# Patient Record
Sex: Female | Born: 1965 | Race: White | Hispanic: No | Marital: Single | Smoking: Never smoker
Health system: Southern US, Community
[De-identification: ages and names within clinical notes are randomized; demographics above are authoritative.]

## PROBLEM LIST (undated history)

## (undated) DIAGNOSIS — R519 Headache, unspecified: Secondary | ICD-10-CM

## (undated) DIAGNOSIS — M810 Age-related osteoporosis without current pathological fracture: Secondary | ICD-10-CM

## (undated) DIAGNOSIS — Z8739 Personal history of other diseases of the musculoskeletal system and connective tissue: Secondary | ICD-10-CM

## (undated) DIAGNOSIS — I10 Essential (primary) hypertension: Secondary | ICD-10-CM

## (undated) DIAGNOSIS — R51 Headache: Secondary | ICD-10-CM

## (undated) HISTORY — PX: WISDOM TOOTH EXTRACTION: SHX21

## (undated) HISTORY — DX: Age-related osteoporosis without current pathological fracture: M81.0

## (undated) HISTORY — DX: Personal history of other diseases of the musculoskeletal system and connective tissue: Z87.39

---

## 1998-11-12 ENCOUNTER — Other Ambulatory Visit: Admission: RE | Admit: 1998-11-12 | Discharge: 1998-11-12 | Payer: Self-pay | Admitting: Obstetrics and Gynecology

## 2000-06-07 ENCOUNTER — Other Ambulatory Visit: Admission: RE | Admit: 2000-06-07 | Discharge: 2000-06-07 | Payer: Self-pay | Admitting: Obstetrics and Gynecology

## 2000-07-15 ENCOUNTER — Other Ambulatory Visit: Admission: RE | Admit: 2000-07-15 | Discharge: 2000-07-15 | Payer: Self-pay | Admitting: Obstetrics and Gynecology

## 2000-07-15 ENCOUNTER — Encounter (INDEPENDENT_AMBULATORY_CARE_PROVIDER_SITE_OTHER): Payer: Self-pay

## 2001-05-16 ENCOUNTER — Other Ambulatory Visit: Admission: RE | Admit: 2001-05-16 | Discharge: 2001-05-16 | Payer: Self-pay | Admitting: Obstetrics and Gynecology

## 2001-11-01 ENCOUNTER — Other Ambulatory Visit: Admission: RE | Admit: 2001-11-01 | Discharge: 2001-11-01 | Payer: Self-pay | Admitting: Obstetrics & Gynecology

## 2002-11-09 ENCOUNTER — Other Ambulatory Visit: Admission: RE | Admit: 2002-11-09 | Discharge: 2002-11-09 | Payer: Self-pay | Admitting: Obstetrics and Gynecology

## 2005-03-18 ENCOUNTER — Other Ambulatory Visit: Admission: RE | Admit: 2005-03-18 | Discharge: 2005-03-18 | Payer: Self-pay | Admitting: Obstetrics and Gynecology

## 2005-09-30 ENCOUNTER — Ambulatory Visit: Payer: Self-pay | Admitting: Gastroenterology

## 2005-11-02 ENCOUNTER — Ambulatory Visit: Payer: Self-pay | Admitting: Gastroenterology

## 2007-02-23 ENCOUNTER — Encounter: Admission: RE | Admit: 2007-02-23 | Discharge: 2007-02-23 | Payer: Self-pay | Admitting: Obstetrics and Gynecology

## 2009-09-17 ENCOUNTER — Emergency Department (HOSPITAL_COMMUNITY): Admission: EM | Admit: 2009-09-17 | Discharge: 2009-09-18 | Payer: Self-pay | Admitting: Emergency Medicine

## 2009-10-04 ENCOUNTER — Encounter: Admission: RE | Admit: 2009-10-04 | Discharge: 2009-10-04 | Payer: Self-pay | Admitting: Gastroenterology

## 2010-08-05 LAB — COMPREHENSIVE METABOLIC PANEL
AST: 18 U/L (ref 0–37)
Alkaline Phosphatase: 44 U/L (ref 39–117)
BUN: 13 mg/dL (ref 6–23)
Chloride: 108 mEq/L (ref 96–112)
GFR calc Af Amer: >60 mL/min (ref >60)
Glucose, Bld: 91 mg/dL (ref 70–99)
Sodium: 137 mEq/L (ref 135–145)
Total Protein: 6.6 g/dL (ref 6.0–8.3)

## 2010-08-05 LAB — CBC
HCT: 38.5 % (ref 36.0–46.0)
Hemoglobin: 12.8 g/dL (ref 12.0–15.0)
MCHC: 33.2 g/dL (ref 30.0–36.0)
MCV: 92.3 fL (ref 78.0–100.0)
RDW: 13.4 % (ref 11.5–15.5)
WBC: 7.1 10*3/uL (ref 4.0–10.5)

## 2010-08-05 LAB — DIFFERENTIAL
Basophils Relative: 0 % (ref 0–1)
Eosinophils Absolute: 0 10*3/uL (ref 0.0–0.7)
Lymphs Abs: 2.3 10*3/uL (ref 0.7–4.0)
Monocytes Absolute: 0.7 10*3/uL (ref 0.1–1.0)
Monocytes Relative: 10 % (ref 3–12)
Neutro Abs: 4 10*3/uL (ref 1.7–7.7)
Neutrophils Relative %: 57 % (ref 43–77)

## 2010-08-05 LAB — URINALYSIS, ROUTINE W REFLEX MICROSCOPIC: Specific Gravity, Urine: 1.018 (ref 1.005–1.030)

## 2010-08-05 LAB — URINE MICROSCOPIC-ADD ON

## 2010-08-05 LAB — PREGNANCY, URINE: Preg Test, Ur: NEGATIVE

## 2011-05-19 DIAGNOSIS — C4492 Squamous cell carcinoma of skin, unspecified: Secondary | ICD-10-CM

## 2011-05-19 HISTORY — DX: Squamous cell carcinoma of skin, unspecified: C44.92

## 2013-08-27 ENCOUNTER — Encounter (HOSPITAL_COMMUNITY): Payer: Self-pay | Admitting: Emergency Medicine

## 2013-08-27 ENCOUNTER — Emergency Department (HOSPITAL_COMMUNITY)
Admission: EM | Admit: 2013-08-27 | Discharge: 2013-08-28 | Disposition: A | Discharge status: Home / Self Care | Payer: BC Managed Care – PPO | Attending: Emergency Medicine | Admitting: Emergency Medicine

## 2013-08-27 DIAGNOSIS — K8020 Calculus of gallbladder without cholecystitis without obstruction: Secondary | ICD-10-CM | POA: Insufficient documentation

## 2013-08-27 DIAGNOSIS — K8050 Calculus of bile duct without cholangitis or cholecystitis without obstruction: Secondary | ICD-10-CM

## 2013-08-27 DIAGNOSIS — R112 Nausea with vomiting, unspecified: Secondary | ICD-10-CM | POA: Insufficient documentation

## 2013-08-27 DIAGNOSIS — K838 Other specified diseases of biliary tract: Secondary | ICD-10-CM | POA: Insufficient documentation

## 2013-08-27 DIAGNOSIS — R079 Chest pain, unspecified: Secondary | ICD-10-CM | POA: Insufficient documentation

## 2013-08-27 DIAGNOSIS — K828 Other specified diseases of gallbladder: Secondary | ICD-10-CM

## 2013-08-27 DIAGNOSIS — Z3202 Encounter for pregnancy test, result negative: Secondary | ICD-10-CM | POA: Insufficient documentation

## 2013-08-27 LAB — COMPREHENSIVE METABOLIC PANEL
ALT: 17 U/L (ref 0–35)
AST: 21 U/L (ref 0–37)
Albumin: 4.1 g/dL (ref 3.5–5.2)
Alkaline Phosphatase: 61 U/L (ref 39–117)
BILIRUBIN TOTAL: 0.4 mg/dL (ref 0.3–1.2)
BUN: 14 mg/dL (ref 6–23)
CO2: 22 meq/L (ref 19–32)
Calcium: 9.2 mg/dL (ref 8.4–10.5)
Chloride: 104 mEq/L (ref 96–112)
Creatinine, Ser: 0.79 mg/dL (ref 0.50–1.10)
GFR calc Af Amer: >90 mL/min (ref >90)
GFR calc non Af Amer: >90 mL/min (ref >90)
GLUCOSE: 113 mg/dL — AB (ref 70–99)
POTASSIUM: 4 meq/L (ref 3.7–5.3)
SODIUM: 138 meq/L (ref 137–147)
Total Protein: 7.6 g/dL (ref 6.0–8.3)

## 2013-08-27 LAB — CBC WITH DIFFERENTIAL/PLATELET
Basophils Absolute: 0 10*3/uL (ref 0.0–0.1)
Basophils Relative: 0 % (ref 0–1)
EOS ABS: 0 10*3/uL (ref 0.0–0.7)
EOS PCT: 0 % (ref 0–5)
HCT: 43.2 % (ref 36.0–46.0)
Hemoglobin: 14.7 g/dL (ref 12.0–15.0)
LYMPHS PCT: 6 % — AB (ref 12–46)
Lymphs Abs: 0.6 10*3/uL — ABNORMAL LOW (ref 0.7–4.0)
MCH: 30.9 pg (ref 26.0–34.0)
MCHC: 34 g/dL (ref 30.0–36.0)
MCV: 90.8 fL (ref 78.0–100.0)
MONO ABS: 0.7 10*3/uL (ref 0.1–1.0)
MONOS PCT: 6 % (ref 3–12)
NEUTROS PCT: 88 % — AB (ref 43–77)
Neutro Abs: 9 10*3/uL — ABNORMAL HIGH (ref 1.7–7.7)
PLATELETS: 225 10*3/uL (ref 150–400)
RBC: 4.76 MIL/uL (ref 3.87–5.11)
RDW: 13.2 % (ref 11.5–15.5)
WBC: 10.3 10*3/uL (ref 4.0–10.5)

## 2013-08-27 LAB — I-STAT TROPONIN, ED: TROPONIN I, POC: 0 ng/mL (ref 0.00–0.08)

## 2013-08-27 LAB — LIPASE, BLOOD: LIPASE: 44 U/L (ref 11–59)

## 2013-08-27 NOTE — ED Notes (Signed)
Pt states that at 4pm she started having midepigastric abdominal/chest pain. Took OTC meds w/ no relief. Some N/V. Denies cardiac or abdominal hx. Alert and oriented.

## 2013-08-28 ENCOUNTER — Emergency Department (HOSPITAL_COMMUNITY): Payer: BC Managed Care – PPO

## 2013-08-28 LAB — URINE MICROSCOPIC-ADD ON

## 2013-08-28 LAB — URINALYSIS, ROUTINE W REFLEX MICROSCOPIC
Bilirubin Urine: NEGATIVE
Glucose, UA: NEGATIVE mg/dL
Hgb urine dipstick: NEGATIVE
KETONES UR: 40 mg/dL — AB
NITRITE: NEGATIVE
PH: 6.5 (ref 5.0–8.0)
PROTEIN: NEGATIVE mg/dL
Specific Gravity, Urine: 1.024 (ref 1.005–1.030)
Urobilinogen, UA: 0.2 mg/dL (ref 0.0–1.0)

## 2013-08-28 LAB — PREGNANCY, URINE: PREG TEST UR: NEGATIVE

## 2013-08-28 LAB — I-STAT TROPONIN, ED: Troponin i, poc: 0 ng/mL (ref 0.00–0.08)

## 2013-08-28 MED ORDER — HYDROCODONE-ACETAMINOPHEN 5-325 MG PO TABS: 1.0000 | ORAL_TABLET | Freq: Four times a day (QID) | ORAL | Status: DC | PRN

## 2013-08-28 MED ORDER — ONDANSETRON 4 MG PO TBDP
4.0000 mg | ORAL_TABLET | Freq: Once | ORAL | Status: AC
  Administered 2013-08-28: 4 mg via ORAL
  Filled 2013-08-28: qty 1

## 2013-08-28 MED ORDER — OXYCODONE-ACETAMINOPHEN 5-325 MG PO TABS
2.0000 | ORAL_TABLET | Freq: Once | ORAL | Status: DC
  Filled 2013-08-28: qty 2

## 2013-08-28 MED ORDER — MORPHINE SULFATE 4 MG/ML IJ SOLN
4.0000 mg | Freq: Once | INTRAMUSCULAR | Status: DC
  Filled 2013-08-28: qty 1

## 2013-08-28 MED ORDER — OXYCODONE-ACETAMINOPHEN 5-325 MG PO TABS
1.0000 | ORAL_TABLET | Freq: Once | ORAL | Status: AC
  Administered 2013-08-28: 1 via ORAL

## 2013-08-28 MED ORDER — ONDANSETRON HCL 4 MG PO TABS: 4.0000 mg | ORAL_TABLET | Freq: Four times a day (QID) | ORAL | Status: DC

## 2013-08-28 MED ORDER — ONDANSETRON HCL 4 MG/2ML IJ SOLN
4.0000 mg | Freq: Once | INTRAMUSCULAR | Status: DC
  Filled 2013-08-28: qty 2

## 2013-08-28 MED ORDER — SODIUM CHLORIDE 0.9 % IV BOLUS (SEPSIS): 1000.0000 mL | Freq: Once | INTRAVENOUS | Status: DC

## 2013-08-28 NOTE — ED Provider Notes (Signed)
Medical screening examination/treatment/procedure(s) were performed by non-physician practitioner and as supervising physician I was immediately available for consultation/collaboration.   EKG Interpretation None        Hoy Morn, MD 08/28/13 [phone]

## 2013-08-28 NOTE — Discharge Instructions (Signed)
Recommend followup with general surgery. You may take Norco as prescribed for severe pain and Zofran as needed for nausea/vomiting. Return to the emergency department if symptoms worsen such as if you develop worsening abdominal pain, fever, uncontrolled vomiting, or any of the symptoms listed below.  Biliary Colic  Biliary colic is a steady or irregular pain in the upper abdomen. It is usually under the right side of the rib cage. It happens when gallstones interfere with the normal flow of bile from the gallbladder. Bile is a liquid that helps to digest fats. Bile is made in the liver and stored in the gallbladder. When you eat a meal, bile passes from the gallbladder through the cystic duct and the common bile duct into the small intestine. There, it mixes with partially digested food. If a gallstone blocks either of these ducts, the normal flow of bile is blocked. The muscle cells in the bile duct contract forcefully to try to move the stone. This causes the pain of biliary colic.  SYMPTOMS   A person with biliary colic usually complains of pain in the upper abdomen. This pain can be:  In the center of the upper abdomen just below the breastbone.  In the upper-right part of the abdomen, near the gallbladder and liver.  Spread back toward the right shoulder blade.  Nausea and vomiting.  The pain usually occurs after eating.  Biliary colic is usually triggered by the digestive system's demand for bile. The demand for bile is high after fatty meals. Symptoms can also occur when a person who has been fasting suddenly eats a very large meal. Most episodes of biliary colic pass after 1 to 5 hours. After the most intense pain passes, your abdomen may continue to ache mildly for about 24 hours. DIAGNOSIS  After you describe your symptoms, your caregiver will perform a physical exam. He or she will pay attention to the upper right portion of your belly (abdomen). This is the area of your liver and  gallbladder. An ultrasound will help your caregiver look for gallstones. Specialized scans of the gallbladder may also be done. Blood tests may be done, especially if you have fever or if your pain persists. PREVENTION  Biliary colic can be prevented by controlling the risk factors for gallstones. Some of these risk factors, such as heredity, increasing age, and pregnancy are a normal part of life. Obesity and a high-fat diet are risk factors you can change through a healthy lifestyle. Women going through menopause who take hormone replacement therapy (estrogen) are also more likely to develop biliary colic. TREATMENT   Pain medication may be prescribed.  You may be encouraged to eat a fat-free diet.  If the first episode of biliary colic is severe, or episodes of colic keep retuning, surgery to remove the gallbladder (cholecystectomy) is usually recommended. This procedure can be done through small incisions using an instrument called a laparoscope. The procedure often requires a brief stay in the hospital. Some people can leave the hospital the same day. It is the most widely used treatment in people troubled by painful gallstones. It is effective and safe, with no complications in more than 90% of cases.  If surgery cannot be done, medication that dissolves gallstones may be used. This medication is expensive and can take months or years to work. Only small stones will dissolve.  Rarely, medication to dissolve gallstones is combined with a procedure called shock-wave lithotripsy. This procedure uses carefully aimed shock waves to break up  gallstones. In many people treated with this procedure, gallstones form again within a few years. PROGNOSIS  If gallstones block your cystic duct or common bile duct, you are at risk for repeated episodes of biliary colic. There is also a 25% chance that you will develop a gallbladder infection(acute cholecystitis), or some other complication of gallstones within  10 to 20 years. If you have surgery, schedule it at a time that is convenient for you and at a time when you are not sick. HOME CARE INSTRUCTIONS   Drink plenty of clear fluids.  Avoid fatty, greasy or fried foods, or any foods that make your pain worse.  Take medications as directed. SEEK MEDICAL CARE IF:   You develop a fever over 100.5 F (38.1 C).  Your pain gets worse over time.  You develop nausea that prevents you from eating and drinking.  You develop vomiting. SEEK IMMEDIATE MEDICAL CARE IF:   You have continuous or severe belly (abdominal) pain which is not relieved with medications.  You develop nausea and vomiting which is not relieved with medications.  You have symptoms of biliary colic and you suddenly develop a fever and shaking chills. This may signal cholecystitis. Call your caregiver immediately.  You develop a yellow color to your skin or the white part of your eyes (jaundice). Document Released: 10/05/2005 Document Revised: 07/27/2011 Document Reviewed: 12/15/2007 [facility] Patient Information 2014 [place].

## 2013-08-28 NOTE — ED Provider Notes (Signed)
Patient care assumed from [facility], PA-C at shift change with U/S pending. Patient presenting for mid epigastric abdominal/chest pain with onset 4 PM. Plan discussed with [place], PA-C which includes discharge if imaging reveal no emergent findings.  4580 - Ultrasound today reveals sludge present within the gallbladder. There is no gallbladder wall thickening, pericholecystic fluid, or sonographic Murphy sign. Symptoms have improved since arrival, per patient. She has no leukocytosis today or elevated LFTs to suggest acute cholecystitis. Get an ultrasound findings, believe patient to be stable and appropriate for discharge today with referral to general surgery to discuss elective cholecystectomy should symptoms persist. Will prescribe pain and nausea medicine for symptom control as needed. Return precautions discussed and imaging results reviewed with patient. Patient agreeable to plan with no unaddressed concerns.  US Abdomen Complete  08/28/2013   CLINICAL DATA:  Abdominal pain  EXAM: ULTRASOUND ABDOMEN COMPLETE  COMPARISON:  None.  FINDINGS: Gallbladder:  There is no cholelithiasis. There is a sludge ball present within the gallbladder. There is a 5 mm non mobile echogenic focus along the gallbladder wall which may represent adherent gallbladder sludge versus a gallbladder polyp. There is no gallbladder wall thickening or pericholecystic fluid. Negative sonographic Murphy sign.  Common bile duct:  Diameter: 4 mm  Liver:  No focal lesion identified. Within normal limits in parenchymal echogenicity.  IVC:  No abnormality visualized.  Pancreas:  Visualized portion unremarkable.  Spleen:  Size and appearance within normal limits.  Right Kidney:  Length: 10.2 cm. Echogenicity within normal limits. No mass or hydronephrosis visualized.  Left Kidney:  Length: 11.1 cm. Echogenicity within normal limits. No mass or hydronephrosis visualized.  Abdominal aorta:  No aneurysm visualized.  Other findings:  None.   IMPRESSION: 1. No sonographic evidence of acute cholecystitis. 2. Gallbladder sludge with possible 5 mm gallbladder polyp.   Electronically Signed   By: Kathreen Devoid   On: 08/28/2013 03:12      Antonietta Breach, PA-C 08/28/13 9983

## 2013-08-28 NOTE — ED Provider Notes (Signed)
CSN: [id number]     Arrival date & time 08/27/13  2101 History   First MD Initiated Contact with Patient 08/28/13 0005     Chief Complaint  Patient presents with  . Abdominal Pain     (Consider location/radiation/quality/duration/timing/severity/associated sxs/prior Treatment) The history is provided by the patient. No language interpreter was used.  Kristi Rush is a 48 y/o F with no known significant past medical history presenting to the ED with epigastric/right upper quadrant/chest pain that started this afternoon at approximately 4:00 PM. Patient reports that the pain started abruptly. Reported that the pain was described as an intense aching sharp pain. Patient stated that she took an antacid with some relief - stated that the pain was bearable for approximately 45 minutes, but reported that the pain would continuously come back. Patient reported that she took an [employer] without relief. Reported that she has been feeling nauseous with one episode of emesis - NB/NB. Reported that her last BM was today, normal. Denied fever, chills, shortness of breath, difficulty breathing, dizziness, abdominal pain, weakness, syncope, chest I., difficulty passing flatulence, melena, hematochezia. No history of hypertension, diabetes, cardiac issues. PCP none   History reviewed. No pertinent past medical history. History reviewed. No pertinent past surgical history. No family history on file. History  Substance Use Topics  . Smoking status: Never Smoker   . Smokeless tobacco: Not on file  . Alcohol Use: No   OB History   Grav Para Term Preterm Abortions TAB SAB Ect Mult Living                 Review of Systems  Constitutional: Negative for fever and chills.  Respiratory: Negative for shortness of breath.   Cardiovascular: Positive for chest pain.  Gastrointestinal: Positive for nausea, vomiting and abdominal pain. Negative for diarrhea, constipation, blood in stool and anal bleeding.   Musculoskeletal: Negative for back pain and neck pain.  Neurological: Negative for weakness.  All other systems reviewed and are negative.     Allergies  Review of patient's allergies indicates no known allergies.  Home Medications   Current Outpatient Rx  Name  Route  Sig  Dispense  Refill  . aspirin-sod bicarb-citric acid (ALKA-SELTZER) 325 MG TBEF tablet   Oral   Take 325 mg by mouth every 6 (six) hours as needed (pain).         . naproxen sodium (ANAPROX) 220 MG tablet   Oral   Take 440 mg by mouth 2 (two) times daily as needed (pain).         . topiramate (TOPAMAX) 100 MG tablet   Oral   Take 100 mg by mouth at bedtime.          BP 121/72  Pulse 63  Temp(Src) 98.1 F (36.7 C) (Oral)  Resp 18  SpO2 97% Physical Exam  Nursing note and vitals reviewed. Constitutional: She is oriented to person, place, and time. She appears well-developed and well-nourished. No distress.  HENT:  Head: Normocephalic and atraumatic.  Mouth/Throat: Oropharynx is clear and moist. No oropharyngeal exudate.  Eyes: Conjunctivae and EOM are normal. Pupils are equal, round, and reactive to light. Right eye exhibits no discharge. Left eye exhibits no discharge.  Neck: Normal range of motion. Neck supple. No tracheal deviation present.  Cardiovascular: Normal rate, regular rhythm and normal heart sounds.  Exam reveals no friction rub.   No murmur heard. Pulses:      Radial pulses are 2+ on the right  side, and 2+ on the left side.       Dorsalis pedis pulses are 2+ on the right side, and 2+ on the left side.  Pulmonary/Chest: Effort normal and breath sounds normal. No respiratory distress. She has no wheezes. She has no rales. She exhibits tenderness.  Discomfort upon palpation to the chest wall - substernal region  Patient is able to speak in full sentences without difficulty  Negative stridor Negative use of accessory muscles  Abdominal: Soft. Bowel sounds are normal. There is  tenderness. There is no guarding.  Discomfort upon palpation to the RUQ, epigastric, and LUQ - most discomfort to the RUQ Positive Murphy's sign Negative McBurney's point  Musculoskeletal: Normal range of motion.  Full ROM to upper and lower extremities without difficulty noted, negative ataxia noted.  Lymphadenopathy:    She has no cervical adenopathy.  Neurological: She is alert and oriented to person, place, and time. No cranial nerve deficit. She exhibits normal muscle tone. Coordination normal.  Skin: Skin is warm and dry. No rash noted. She is not diaphoretic. No erythema.  Psychiatric: She has a normal mood and affect. Her behavior is normal. Thought content normal.    ED Course  Procedures (including critical care time)  Results for orders placed during the hospital encounter of 08/27/13  CBC WITH DIFFERENTIAL      Result Value Ref Range   WBC 10.3  4.0 - 10.5 K/uL   RBC 4.76  3.87 - 5.11 MIL/uL   Hemoglobin 14.7  12.0 - 15.0 g/dL   HCT 43.2  36.0 - 46.0 %   MCV 90.8  78.0 - 100.0 fL   MCH 30.9  26.0 - 34.0 pg   MCHC 34.0  30.0 - 36.0 g/dL   RDW 13.2  11.5 - 15.5 %   Platelets 225  150 - 400 K/uL   Neutrophils Relative % 88 (*) 43 - 77 %   Neutro Abs 9.0 (*) 1.7 - 7.7 K/uL   Lymphocytes Relative 6 (*) 12 - 46 %   Lymphs Abs 0.6 (*) 0.7 - 4.0 K/uL   Monocytes Relative 6  3 - 12 %   Monocytes Absolute 0.7  0.1 - 1.0 K/uL   Eosinophils Relative 0  0 - 5 %   Eosinophils Absolute 0.0  0.0 - 0.7 K/uL   Basophils Relative 0  0 - 1 %   Basophils Absolute 0.0  0.0 - 0.1 K/uL  COMPREHENSIVE METABOLIC PANEL      Result Value Ref Range   Sodium 138  137 - 147 mEq/L   Potassium 4.0  3.7 - 5.3 mEq/L   Chloride 104  96 - 112 mEq/L   CO2 22  19 - 32 mEq/L   Glucose, Bld 113 (*) 70 - 99 mg/dL   BUN 14  6 - 23 mg/dL   Creatinine, Ser 0.79  0.50 - 1.10 mg/dL   Calcium 9.2  8.4 - 10.5 mg/dL   Total Protein 7.6  6.0 - 8.3 g/dL   Albumin 4.1  3.5 - 5.2 g/dL   AST 21  0 - 37 U/L    ALT 17  0 - 35 U/L   Alkaline Phosphatase 61  39 - 117 U/L   Total Bilirubin 0.4  0.3 - 1.2 mg/dL   GFR calc non Af Amer >90  >90 mL/min   GFR calc Af Amer >90  >90 mL/min  LIPASE, BLOOD      Result Value Ref Range   Lipase  44  11 - 59 U/L  URINALYSIS, ROUTINE W REFLEX MICROSCOPIC      Result Value Ref Range   Color, Urine YELLOW  YELLOW   APPearance CLOUDY (*) CLEAR   Specific Gravity, Urine 1.024  1.005 - 1.030   pH 6.5  5.0 - 8.0   Glucose, UA NEGATIVE  NEGATIVE mg/dL   Hgb urine dipstick NEGATIVE  NEGATIVE   Bilirubin Urine NEGATIVE  NEGATIVE   Ketones, ur 40 (*) NEGATIVE mg/dL   Protein, ur NEGATIVE  NEGATIVE mg/dL   Urobilinogen, UA 0.2  0.0 - 1.0 mg/dL   Nitrite NEGATIVE  NEGATIVE   Leukocytes, UA TRACE (*) NEGATIVE  URINE MICROSCOPIC-ADD ON      Result Value Ref Range   Squamous Epithelial / LPF MANY (*) RARE   WBC, UA 3-6  <3 WBC/hpf   RBC / HPF 0-2  <3 RBC/hpf   Bacteria, UA MANY (*) RARE  I-STAT TROPOININ, ED      Result Value Ref Range   Troponin i, poc 0.00  0.00 - 0.08 ng/mL   Comment 3             Labs Review Labs Reviewed  CBC WITH DIFFERENTIAL - Abnormal; Notable for the following:    Neutrophils Relative % 88 (*)    Neutro Abs 9.0 (*)    Lymphocytes Relative 6 (*)    Lymphs Abs 0.6 (*)    All other components within normal limits  COMPREHENSIVE METABOLIC PANEL - Abnormal; Notable for the following:    Glucose, Bld 113 (*)    All other components within normal limits  URINALYSIS, ROUTINE W REFLEX MICROSCOPIC - Abnormal; Notable for the following:    APPearance CLOUDY (*)    Ketones, ur 40 (*)    Leukocytes, UA TRACE (*)    All other components within normal limits  URINE MICROSCOPIC-ADD ON - Abnormal; Notable for the following:    Squamous Epithelial / LPF MANY (*)    Bacteria, UA MANY (*)    All other components within normal limits  LIPASE, BLOOD  I-STAT TROPOININ, ED  I-STAT TROPOININ, ED   Imaging Review Dg Chest 2 View  08/28/2013    CLINICAL DATA:  Upper abdominal pain  EXAM: CHEST  2 VIEW  COMPARISON:  CT ABD/PELVIS W CM dated 10/04/2009  FINDINGS: The heart size and mediastinal contours are within normal limits. Both lungs are clear. The visualized skeletal structures are unremarkable.  IMPRESSION: No active cardiopulmonary disease.   Electronically Signed   By: Kathreen Devoid   On: 08/28/2013 00:50     EKG Interpretation None      Date: 08/28/2013  Rate: 70  Rhythm: normal sinus rhythm  QRS Axis: normal  Intervals: normal  ST/T Wave abnormalities: normal  Conduction Disutrbances:none  Narrative Interpretation:   Old EKG Reviewed: none available EKG analyzed and reviewed by this provider and attending physician   MDM   Final diagnoses:  None   Medications  morphine 4 MG/ML injection 4 mg (not administered)  ondansetron (ZOFRAN) injection 4 mg (not administered)  sodium chloride 0.9 % bolus 1,000 mL (not administered)   Filed Vitals:   08/27/13 2125 08/28/13 0149  BP: 122/82 121/72  Pulse: 82 63  Temp: 98.1 F (36.7 C)   TempSrc: Oral   Resp:  18  SpO2: 98% 97%    Patient presenting to the ED with chest discomfort localized to the epigastric region and RUQ with discomfort in the substernal region that  started yesterday afternoon at approximately 4:00PM. Patient reported that the pain is a dull aching sensation. Reported that she used antacids with minimal relief, reported alka seltzer did not help. Reported nausea with one episode of emesis.  Alert and oriented. GCS 15. Heart rate and rhythm normal. Lungs clear to auscultation to upper and lower lobes bilaterally. Radial and DP pulses 2+ bilaterally. Negative signs of respiratory distress. Patient stable to speak in full sentences without difficulty. Negative stridor or use of accessory muscles. Bowel sounds normoactive in all 4 quadrants with discomfort upon palpation to the epigastric, left upper quadrant and right upper quadrant regions-most discomfort  upon right upper quadrant. Positive Murphy's sign. Negative McBurney's point. Negative peritoneal signs. EKG noted normal sinus rhythm with a heart rate of 70 bpm - negative ischemic findings noted. First troponin negative elevation. CBC noted negative elevated white blood cell count-elevated neutrophils at 9.0. CMP negative findings-kidneys and liver function well, negative elevated alkaline phosphatase. Negative elevated lipase. Urinalysis negative hemoglobin identified-negative hematuria. Positive trace of leukocytes with many bacteria with white blood cell count of 3-6. Chest x-ray negative for acute cardiac pulmonary disease. Suspicion to be possible gallbladder issue. Patient given IV fluids, pain medications and time medications. Ultrasound of the abdomen has been ordered results pending. At this time patient does not appear septic-patient stable, afebrile. Discussed case with Antonietta Breach, PA-C. Transfer of care to [facility], PA-C at change in shift.   Jamse Mead, PA-C 08/28/13 1136

## 2013-08-29 LAB — URINE CULTURE
Colony Count: 9000
Special Requests: NORMAL

## 2013-09-03 NOTE — ED Provider Notes (Signed)
Medical screening examination/treatment/procedure(s) were conducted as a shared visit with non-physician practitioner(s) and myself.  I personally evaluated the patient during the encounter.   EKG Interpretation   Date/Time:  Sunday August 27 2013 21:36:32 EDT Ventricular Rate:  70 PR Interval:  168 QRS Duration: 89 QT Interval:  407 QTC Calculation: 439 R Axis:   88 Text Interpretation:  Sinus rhythm Consider right atrial enlargement  Baseline wander in lead(s) I ED PHYSICIAN INTERPRETATION AVAILABLE IN [place]  [facility] Confirmed by TEST, Record ([id number]) on 08/29/2013 7:52:18 AM      Overall well appearing. Improvement in symptoms. Biliary colic. No signs to suggest cholecystitis. General surgery follow up  Hoy Morn, MD 09/03/13 [phone]

## 2013-09-11 ENCOUNTER — Encounter (INDEPENDENT_AMBULATORY_CARE_PROVIDER_SITE_OTHER): Payer: Self-pay | Admitting: Surgery

## 2013-09-11 ENCOUNTER — Ambulatory Visit (INDEPENDENT_AMBULATORY_CARE_PROVIDER_SITE_OTHER): Payer: BC Managed Care – PPO | Admitting: Surgery

## 2013-09-11 VITALS — BP 124/74 | HR 68 | Temp 97.5°F | Ht 62.0 in | Wt 170.6 lb

## 2013-09-11 DIAGNOSIS — E669 Obesity, unspecified: Secondary | ICD-10-CM | POA: Insufficient documentation

## 2013-09-11 DIAGNOSIS — K8010 Calculus of gallbladder with chronic cholecystitis without obstruction: Secondary | ICD-10-CM

## 2013-09-11 DIAGNOSIS — K824 Cholesterolosis of gallbladder: Secondary | ICD-10-CM

## 2013-09-11 DIAGNOSIS — K5909 Other constipation: Secondary | ICD-10-CM

## 2013-09-11 DIAGNOSIS — K5900 Constipation, unspecified: Secondary | ICD-10-CM

## 2013-09-11 NOTE — Patient Instructions (Addendum)
Please consider the recommendations that we have given you today:  Consider laparoscopic removal of her gallbladder for symptomatic sludge and gallbladder polyp.  Consider more aggressive fiber bowel regimen to help with yourr chronic constipation.  See the Handout(s) we have given you.  Please call our office at [phone] if you wish to schedule surgery or if you have further questions / concerns.   Cholecystitis Cholecystitis is an inflammation of your gallbladder. It is usually caused by a buildup of gallstones or sludge (cholelithiasis) in your gallbladder. The gallbladder stores a fluid that helps digest fats (bile). Cholecystitis is serious and needs treatment right away.  CAUSES   Gallstones. Gallstones can block the tube that leads to your gallbladder, causing bile to build up. As bile builds up, the gallbladder becomes inflamed.  Bile duct problems, such as blockage from scarring or kinking.  Tumors. Tumors can stop bile from leaving your gallbladder correctly, causing bile to build up. As bile builds up, the gallbladder becomes inflamed. SYMPTOMS   Nausea.  Vomiting.  Abdominal pain, especially in the upper right area of your abdomen.  Abdominal tenderness or bloating.  Sweating.  Chills.  Fever.  Yellowing of the skin and the whites of the eyes (jaundice). DIAGNOSIS  Your caregiver may order blood tests to look for infection or gallbladder problems. Your caregiver may also order imaging tests, such as an ultrasound or computed tomography (CT) scan. Further tests may include a hepatobiliary iminodiacetic acid (HIDA) scan. This scan allows your caregiver to see your bile move from the liver to the gallbladder and to the small intestine. TREATMENT  A hospital stay is usually necessary to lessen the inflammation of your gallbladder. You may be required to not eat or drink (fast) for a certain amount of time. You may be given medicine to treat pain or an antibiotic  medicine to treat an infection. Surgery may be needed to remove your gallbladder (cholecystectomy) once the inflammation has gone down. Surgery may be needed right away if you develop complications such as death of gallbladder tissue (gangrene) or a tear (perforation) of the gallbladder.  [place] care will depend on your treatment. In general:  If you were given antibiotics, take them as directed. Finish them even if you start to feel better.  Only take over-the-counter or prescription medicines for pain, discomfort, or fever as directed by your caregiver.  Follow a low-fat diet until you see your caregiver again.  Keep all follow-up visits as directed by your caregiver. SEEK IMMEDIATE MEDICAL CARE IF:   Your pain is increasing and not controlled by medicines.  Your pain moves to another part of your abdomen or to your back.  You have a fever.  You have nausea and vomiting. MAKE SURE YOU:  Understand these instructions.  Will watch your condition.  Will get help right away if you are not doing well or get worse. Document Released: 05/04/2005 Document Revised: 07/27/2011 Document Reviewed: 03/20/2011 [facility] Patient Information 2014 [place], [place].  GETTING TO GOOD BOWEL HEALTH. Irregular bowel habits such as constipation and diarrhea can lead to many problems over time.  Having one soft bowel movement a day is the most important way to prevent further problems.  The anorectal canal is designed to handle stretching and feces to safely manage our ability to get rid of solid waste (feces, poop, stool) out of our body.  BUT, hard constipated stools can act like ripping concrete bricks and diarrhea can be a burning  fire to this very sensitive area of our body, causing inflamed hemorrhoids, anal fissures, increasing risk is perirectal abscesses, abdominal pain/bloating, an making irritable bowel worse.     The goal: ONE SOFT BOWEL MOVEMENT A DAY!  To have soft,  regular bowel movements:    Drink at least 8 tall glasses of water a day.     Take plenty of fiber.  Fiber is the undigested part of plant food that passes into the colon, acting s "natures broom" to encourage bowel motility and movement.  Fiber can absorb and hold large amounts of water. This results in a larger, bulkier stool, which is soft and easier to pass. Work gradually over several weeks up to 6 servings a day of fiber (25g a day even more if needed) in the form of: o Vegetables -- Root (potatoes, carrots, turnips), leafy green (lettuce, salad greens, celery, spinach), or cooked high residue (cabbage, broccoli, etc) o Fruit -- Fresh (unpeeled skin & pulp), Dried (prunes, apricots, cherries, etc ),  or stewed ( applesauce)  o Whole grain breads, pasta, etc (whole wheat)  o Bran cereals    Bulking Agents -- This type of water-retaining fiber generally is easily obtained each day by one of the following:  o Psyllium bran -- The psyllium plant is remarkable because its ground seeds can retain so much water. This product is available as Metamucil, Konsyl, Effersyllium, Per Diem Fiber, or the less expensive generic preparation in drug and health food stores. Although labeled a laxative, it really is not a laxative.  o Methylcellulose -- This is another fiber derived from wood which also retains water. It is available as Citrucel. o Polyethylene Glycol - and "artificial" fiber commonly called Miralax or Glycolax.  It is helpful for people with gassy or bloated feelings with regular fiber o Flax Seed - a less gassy fiber than psyllium   No reading or other relaxing activity while on the toilet. If bowel movements take longer than 5 minutes, you are too constipated   AVOID CONSTIPATION.  High fiber and water intake usually takes care of this.  Sometimes a laxative is needed to stimulate more frequent bowel movements, but    Laxatives are not a good long-term solution as it can wear the colon  out. o Osmotics (Milk of Magnesia, Fleets phosphosoda, Magnesium citrate, MiraLax, GoLytely) are safer than  o Stimulants (Senokot, Castor Oil, Dulcolax, Ex Lax)    o Do not take laxatives for more than 7days in a row.    IF SEVERELY CONSTIPATED, try a Bowel Retraining Program: o Do not use laxatives.  o Eat a diet high in roughage, such as bran cereals and leafy vegetables.  o Drink six (6) ounces of prune or apricot juice each morning.  o Eat two (2) large servings of stewed fruit each day.  o Take one (1) heaping tablespoon of a psyllium-based bulking agent twice a day. Use sugar-free sweetener when possible to avoid excessive calories.  o Eat a normal breakfast.  o Set aside 15 minutes after breakfast to sit on the toilet, but do not strain to have a bowel movement.  o If you do not have a bowel movement by the third day, use an enema and repeat the above steps.    Controlling diarrhea o Switch to liquids and simpler foods for a few days to avoid stressing your intestines further. o Avoid dairy products (especially milk & ice cream) for a short time.  The intestines often can  lose the ability to digest lactose when stressed. o Avoid foods that cause gassiness or bloating.  Typical foods include beans and other legumes, cabbage, broccoli, and dairy foods.  Every person has some sensitivity to other foods, so listen to our body and avoid those foods that trigger problems for you. o Adding fiber (Citrucel, Metamucil, psyllium, Miralax) gradually can help thicken stools by absorbing excess fluid and retrain the intestines to act more normally.  Slowly increase the dose over a few weeks.  Too much fiber too soon can backfire and cause cramping & bloating. o Probiotics (such as active yogurt, Align, etc) may help repopulate the intestines and colon with normal bacteria and calm down a sensitive digestive tract.  Most studies show it to be of mild help, though, and such products can be  costly. o Medicines:   Bismuth subsalicylate (ex. Kayopectate, Pepto Bismol) every 30 minutes for up to 6 doses can help control diarrhea.  Avoid if pregnant.   Loperamide (Immodium) can slow down diarrhea.  Start with two tablets (4mg  total) first and then try one tablet every 6 hours.  Avoid if you are having fevers or severe pain.  If you are not better or start feeling worse, stop all medicines and call your doctor for advice o Call your doctor if you are getting worse or not better.  Sometimes further testing (cultures, endoscopy, X-ray studies, bloodwork, etc) may be needed to help diagnose and treat the cause of the diarrhea.  LAPAROSCOPIC SURGERY: POST OP INSTRUCTIONS  1. DIET: Follow a light bland diet the first 24 hours after arrival home, such as soup, liquids, crackers, etc.  Be sure to include lots of fluids daily.  Avoid fast food or heavy meals as your are more likely to get nauseated.  Eat a low fat the next few days after surgery.   2. Take your usually prescribed home medications unless otherwise directed. 3. PAIN CONTROL: a. Pain is best controlled by a usual combination of three different methods TOGETHER: i. Ice/Heat ii. Over the counter pain medication iii. Prescription pain medication b. Most patients will experience some swelling and bruising around the incisions.  Ice packs or heating pads (30-60 minutes up to 6 times a day) will help. Use ice for the first few days to help decrease swelling and bruising, then switch to heat to help relax tight/sore spots and speed recovery.  Some people prefer to use ice alone, heat alone, alternating between ice & heat.  Experiment to what works for you.  Swelling and bruising can take several weeks to resolve.   c. It is helpful to take an over-the-counter pain medication regularly for the first few weeks.  Choose one of the following that works best for you: i. Naproxen (Aleve, etc)  Two 220mg  tabs twice a day ii. Ibuprofen (Advil, etc)  Three 200mg  tabs four times a day (every meal & bedtime) iii. Acetaminophen (Tylenol, etc) 500-650mg  four times a day (every meal & bedtime) d. A  prescription for pain medication (such as oxycodone, hydrocodone, etc) should be given to you upon discharge.  Take your pain medication as prescribed.  i. If you are having problems/concerns with the prescription medicine (does not control pain, nausea, vomiting, rash, itching, etc), please call us [phone] to see if we need to switch you to a different pain medicine that will work better for you and/or control your side effect better. ii. If you need a refill on your pain medication, please contact  your pharmacy.  They will contact our office to request authorization. Prescriptions will not be filled after 5 pm or on week-ends. 4. Avoid getting constipated.  Between the surgery and the pain medications, it is common to experience some constipation.  Increasing fluid intake and taking a fiber supplement (such as Metamucil, Citrucel, FiberCon, MiraLax, etc) 1-2 times a day regularly will usually help prevent this problem from occurring.  A mild laxative (prune juice, Milk of Magnesia, MiraLax, etc) should be taken according to package directions if there are no bowel movements after 48 hours.   5. Watch out for diarrhea.  If you have many loose bowel movements, simplify your diet to bland foods & liquids for a few days.  Stop any stool softeners and decrease your fiber supplement.  Switching to mild anti-diarrheal medications (Kayopectate, Pepto Bismol) can help.  If this worsens or does not improve, please call us. 6. Wash / shower every day.  You may shower over the dressings as they are waterproof.  Continue to shower over incision(s) after the dressing is off. 7. Remove your waterproof bandages 5 days after surgery.  You may leave the incision open to air.  You may replace a dressing/Band-Aid to cover the incision for comfort if you wish.  8. ACTIVITIES  as tolerated:   a. You may resume regular (light) daily activities beginning the next day-such as daily self-care, walking, climbing stairs-gradually increasing activities as tolerated.  If you can walk 30 minutes without difficulty, it is safe to try more intense activity such as jogging, treadmill, bicycling, low-impact aerobics, swimming, etc. b. Save the most intensive and strenuous activity for last such as sit-ups, heavy lifting, contact sports, etc  Refrain from any heavy lifting or straining until you are off narcotics for pain control.   c. DO NOT PUSH THROUGH PAIN.  Let pain be your guide: If it hurts to do something, don't do it.  Pain is your body warning you to avoid that activity for another week until the pain goes down. d. You may drive when you are no longer taking prescription pain medication, you can comfortably wear a seatbelt, and you can safely maneuver your car and apply brakes. e. Dennis Bast may have sexual intercourse when it is comfortable.  9. FOLLOW UP in our office a. Please call [facility] at [phone] to set up an appointment to see your surgeon in the office for a follow-up appointment approximately 2-3 weeks after your surgery. b. Make sure that you call for this appointment the day you arrive home to insure a convenient appointment time. 10. IF YOU HAVE DISABILITY OR FAMILY LEAVE FORMS, BRING THEM TO THE OFFICE FOR PROCESSING.  DO NOT GIVE THEM TO YOUR DOCTOR.   WHEN TO CALL us [phone]: 1. Poor pain control 2. Reactions / problems with new medications (rash/itching, nausea, etc)  3. Fever over 101.5 F (38.5 C) 4. Inability to urinate 5. Nausea and/or vomiting 6. Worsening swelling or bruising 7. Continued bleeding from incision. 8. Increased pain, redness, or drainage from the incision   The clinic staff is available to answer your questions during regular business hours (8:30am-5pm).  Please don't hesitate to call and ask to speak to one of our nurses for  clinical concerns.   If you have a medical emergency, go to the nearest emergency room or call 911.  A surgeon from [facility] is always on call at the [facility], [place]  [address] [place], [place], [place]  [address] ? MAIN: [phone] ? TOLL FREE: [phone] ?  FAX [phone] [email or web address]  Constipation, Adult Constipation is when a person has fewer than 3 bowel movements a week; has difficulty having a bowel movement; or has stools that are dry, hard, or larger than normal. As people grow older, constipation is more common. If you try to fix constipation with medicines that make you have a bowel movement (laxatives), the problem may get worse. Long-term laxative use may cause the muscles of the colon to become weak. A low-fiber diet, not taking in enough fluids, and taking certain medicines may make constipation worse. CAUSES   Certain medicines, such as antidepressants, pain medicine, iron supplements, antacids, and water pills.   Certain diseases, such as diabetes, irritable bowel syndrome (IBS), thyroid disease, or depression.   Not drinking enough water.   Not eating enough fiber-rich foods.   Stress or travel.  Lack of physical activity or exercise.  Not going to the restroom when there is the urge to have a bowel movement.  Ignoring the urge to have a bowel movement.  Using laxatives too much. SYMPTOMS   Having fewer than 3 bowel movements a week.   Straining to have a bowel movement.   Having hard, dry, or larger than normal stools.   Feeling full or bloated.   Pain in the lower abdomen.  Not feeling relief after having a bowel movement. DIAGNOSIS  Your caregiver will take a medical history and perform a physical exam. Further testing may be done for severe constipation. Some tests may include:   A barium enema X-ray to examine your rectum, colon, and sometimes, your small  intestine.  A sigmoidoscopy to examine your lower colon.  A colonoscopy to examine your entire colon. TREATMENT  Treatment will depend on the severity of your constipation and what is causing it. Some dietary treatments include drinking more fluids and eating more fiber-rich foods. Lifestyle treatments may include regular exercise. If these diet and lifestyle recommendations do not help, your caregiver may recommend taking over-the-counter laxative medicines to help you have bowel movements. Prescription medicines may be prescribed if over-the-counter medicines do not work.  HOME CARE INSTRUCTIONS   Increase dietary fiber in your diet, such as fruits, vegetables, whole grains, and beans. Limit high-fat and processed sugars in your diet, such as [place] fries, hamburgers, cookies, candies, and soda.   A fiber supplement may be added to your diet if you cannot get enough fiber from foods.   Drink enough fluids to keep your urine clear or pale yellow.   Exercise regularly or as directed by your caregiver.   Go to the restroom when you have the urge to go. Do not hold it.  Only take medicines as directed by your caregiver. Do not take other medicines for constipation without talking to your caregiver first. [place] IF:   You have bright red blood in your stool.   Your constipation lasts for more than 4 days or gets worse.   You have abdominal or rectal pain.   You have thin, pencil-like stools.  You have unexplained weight loss. MAKE SURE YOU:   Understand these instructions.  Will watch your condition.  Will get help right away if you are not doing well or get worse. Document Released: 01/31/2004 Document Revised: 07/27/2011 Document Reviewed: 02/13/2013 [facility] Patient Information 2014 [place], [place].

## 2013-09-11 NOTE — Progress Notes (Signed)
Subjective:     Patient ID: Kristi Rush, female   DOB: [birth year], 48 y.o.   MRN: [id number]  HPI  Note: This dictation was prepared with Dragon/digital dictation along with [facility] technology. Any transcriptional errors that result from this process are unintentional.       ETTIE KRONTZ  1965/11/09 [id number]  Patient Care Team: No Pcp Per Patient as PCP - General (General Practice)  This patient is a 48 y.o.female who presents today for surgical evaluation at the request of Dr. Jola Schmidt, [facility] ED.   Reason for visit: Biliary colic with sludge/polyp  Pleasant obese female struggled with epigatric/RUQ 10/10 sharp pain.  NO help with walking/sips.  Recurring crampy pains.  Worsened - nausea/vomiting.  They have had a similar episode in 2011 according to ER visits.  CT scan that was negative.  Perhaps he gastroenterology.  Struggles with chronic constipation.  [place] nephew gastroenterologist in town.  Colonoscopy.  They recommended bowel regimen.  She said that has not helped.  Working with a nutritionist now.  She walks born miles a day.  She is to have a bowel movement once a week.  She now has one twice a week   No history of hepatitis or pancreatitis.  No personal nor family history of GI/colon cancer, inflammatory bowel disease, irritable bowel syndrome, allergy such as Celiac Sprue, dietary/dairy problems, colitis, ulcers nor gastritis.  No recent sick contacts/gastroenteritis.  No travel outside the country.  No changes in diet.  No dysphagia to solids or liquids.  No significant heartburn or reflux.  No hematochezia, hematemesis, coffee ground emesis.  No evidence of prior gastric/peptic ulceration.  She drinks alcohol maybe a few times a month.  No binge drinking.   Patient Active Problem List   Diagnosis Date Noted  . Polyp of gallbladder 09/11/2013  . Chronic cholecystitis with sludge/calculus 09/11/2013  . Obesity (BMI 30-39.9) 09/11/2013  . Constipation, chronic 09/11/2013     History reviewed. No pertinent past medical history.  History reviewed. No pertinent past surgical history.  History   Social History  . Marital Status: Married    Spouse Name: N/A    Number of Children: N/A  . Years of Education: N/A   Occupational History  . Not on file.   Social History Main Topics  . Smoking status: Never Smoker   . Smokeless tobacco: Not on file  . Alcohol Use: No  . Drug Use: No  . Sexual Activity: Not on file   Other Topics Concern  . Not on file   Social History Narrative  . No narrative on file    History reviewed. No pertinent family history.  Current Outpatient Prescriptions  Medication Sig Dispense Refill  . naproxen sodium (ANAPROX) 220 MG tablet Take 440 mg by mouth 2 (two) times daily as needed (pain).      . topiramate (TOPAMAX) 100 MG tablet Take 100 mg by mouth at bedtime.       No current facility-administered medications for this visit.     No Known Allergies  BP 124/74  Pulse 68  Temp(Src) 97.5 F (36.4 C)  Ht 5\' 2"  (1.575 m)  Wt 170 lb 9.6 oz (77.384 kg)  BMI 31.20 kg/m2  Dg Chest 2 View  08/28/2013   CLINICAL DATA:  Upper abdominal pain  EXAM: CHEST  2 VIEW  COMPARISON:  CT ABD/PELVIS W CM dated 10/04/2009  FINDINGS: The heart size and mediastinal contours are within normal limits. Both lungs are clear.  The visualized skeletal structures are unremarkable.  IMPRESSION: No active cardiopulmonary disease.   Electronically Signed   By: Kathreen Devoid   On: 08/28/2013 00:50   US Abdomen Complete  08/28/2013   CLINICAL DATA:  Abdominal pain  EXAM: ULTRASOUND ABDOMEN COMPLETE  COMPARISON:  None.  FINDINGS: Gallbladder:  There is no cholelithiasis. There is a sludge ball present within the gallbladder. There is a 5 mm non mobile echogenic focus along the gallbladder wall which may represent adherent gallbladder sludge versus a gallbladder polyp. There is no gallbladder wall thickening or pericholecystic fluid. Negative  sonographic Murphy sign.  Common bile duct:  Diameter: 4 mm  Liver:  No focal lesion identified. Within normal limits in parenchymal echogenicity.  IVC:  No abnormality visualized.  Pancreas:  Visualized portion unremarkable.  Spleen:  Size and appearance within normal limits.  Right Kidney:  Length: 10.2 cm. Echogenicity within normal limits. No mass or hydronephrosis visualized.  Left Kidney:  Length: 11.1 cm. Echogenicity within normal limits. No mass or hydronephrosis visualized.  Abdominal aorta:  No aneurysm visualized.  Other findings:  None.  IMPRESSION: 1. No sonographic evidence of acute cholecystitis. 2. Gallbladder sludge with possible 5 mm gallbladder polyp.   Electronically Signed   By: Kathreen Devoid   On: 08/28/2013 03:12     Review of Systems  Constitutional: Negative for fever, chills, diaphoresis, appetite change and fatigue.  HENT: Negative for ear discharge, ear pain, sore throat and trouble swallowing.   Eyes: Negative for photophobia, discharge and visual disturbance.  Respiratory: Negative for cough, choking, chest tightness and shortness of breath.   Cardiovascular: Negative for chest pain and palpitations.  Gastrointestinal: Positive for nausea, vomiting, abdominal pain and constipation. Negative for diarrhea, anal bleeding and rectal pain.  Endocrine: Negative for cold intolerance and heat intolerance.  Genitourinary: Negative for dysuria, frequency and difficulty urinating.  Musculoskeletal: Negative for gait problem, myalgias and neck pain.  Skin: Negative for color change, pallor and rash.  Allergic/Immunologic: Negative for environmental allergies, food allergies and immunocompromised state.  Neurological: Negative for dizziness, speech difficulty, weakness and numbness.  Hematological: Negative for adenopathy.  Psychiatric/Behavioral: Negative for confusion and agitation. The patient is not nervous/anxious.        Objective:   Physical Exam  Constitutional: She  is oriented to person, place, and time. She appears well-developed and well-nourished. No distress.  HENT:  Head: Normocephalic.  Mouth/Throat: Oropharynx is clear and moist. No oropharyngeal exudate.  Eyes: Conjunctivae and EOM are normal. Pupils are equal, round, and reactive to light. No scleral icterus.  Neck: Normal range of motion. Neck supple. No tracheal deviation present.  Cardiovascular: Normal rate, regular rhythm and intact distal pulses.   Pulmonary/Chest: Effort normal and breath sounds normal. No stridor. No respiratory distress. She exhibits no tenderness.  Abdominal: Soft. She exhibits no distension and no mass. There is no tenderness. Hernia confirmed negative in the right inguinal area and confirmed negative in the left inguinal area.  Genitourinary: No vaginal discharge found.  Musculoskeletal: Normal range of motion. She exhibits no tenderness.       Right elbow: She exhibits normal range of motion.       Left elbow: She exhibits normal range of motion.       Right wrist: She exhibits normal range of motion.       Left wrist: She exhibits normal range of motion.       Right hand: Normal strength noted.  Left hand: Normal strength noted.  Lymphadenopathy:       Head (right side): No posterior auricular adenopathy present.       Head (left side): No posterior auricular adenopathy present.    She has no cervical adenopathy.    She has no axillary adenopathy.       Right: No inguinal adenopathy present.       Left: No inguinal adenopathy present.  Neurological: She is alert and oriented to person, place, and time. No cranial nerve deficit. She exhibits normal muscle tone. Coordination normal.  Skin: Skin is warm and dry. No rash noted. She is not diaphoretic. No erythema.  Psychiatric: Her speech is normal and behavior is normal. Judgment and thought content normal. Cognition and memory are normal.  Giggly/smiling at 1st       Assessment:     Postparandial  nausea, vomiting, & right upper quadrant pain concerning for chronic cholecystitis with gallbladder sludge and polyp     Plan:     I think she would benefit from cholecystectomy since she has had another attack.  She elected to wait until after the school year in early June.  Single site lap chole OK>  I cautioned her that if she has another attack that is severe that she may require more urgent intervention.  However, is reasonable see if we can get away with waiting:  The anatomy & physiology of hepatobiliary & pancreatic function was discussed.  The pathophysiology of gallbladder dysfunction was discussed.  Natural history risks without surgery was discussed.   I feel the risks of no intervention will lead to serious problems that outweigh the operative risks; therefore, I recommended cholecystectomy to remove the pathology.  I explained laparoscopic techniques with possible need for an open approach.  Probable cholangiogram to evaluate the bilary tract was explained as well.    Risks such as bleeding, infection, abscess, leak, injury to other organs, need for further treatment, heart attack, death, and other risks were discussed.  I noted a good likelihood this will help address the problem.  Possibility that this will not correct all abdominal symptoms was explained.  Goals of post-operative recovery were discussed as well.  We will work to minimize complications.  An educational handout further explaining the pathology and treatment options was given as well.  Questions were answered.  The patient expresses understanding & wishes to proceed with surgery.  Consider restarting some type of bladder fiber regimen for constipation.  Suspect MiraLAX Will work since she has some gassiness with Metamucil.  Probably needs 3-4 doses a day.  Consider gastroenterology reevaluation is still struggling. 1) Avoid extremes of bowel movements (no bad constipation/diarrhea) 2) Miralax 17gm mixed in 8oz. water or  juice-daily. Start BID & then increase PRN.  3) Gas-x,Phazyme, etc. as needed for gas & bloating.  4) Soft,bland diet. No spicy,greasy,fried foods.  5) Prilosec OTC as needed  6) May hold gluten/wheat products from diet to see if symptoms improve.  7)  May try probiotics (Align, Activa, etc) to help calm the bowels down 7) If symptoms become worse call back immediately.

## 2013-09-20 ENCOUNTER — Telehealth (INDEPENDENT_AMBULATORY_CARE_PROVIDER_SITE_OTHER): Payer: Self-pay | Admitting: Surgery

## 2013-09-20 NOTE — Telephone Encounter (Signed)
Talked to patient about surgery gave dates patient will call back to schedule with payment.

## 2014-01-08 ENCOUNTER — Other Ambulatory Visit: Payer: Self-pay | Admitting: Obstetrics and Gynecology

## 2014-01-08 DIAGNOSIS — R928 Other abnormal and inconclusive findings on diagnostic imaging of breast: Secondary | ICD-10-CM

## 2014-01-17 ENCOUNTER — Ambulatory Visit
Admission: RE | Admit: 2014-01-17 | Discharge: 2014-01-17 | Disposition: A | Discharge status: Home / Self Care | Payer: BC Managed Care – PPO | Source: Ambulatory Visit | Attending: Obstetrics and Gynecology | Admitting: Obstetrics and Gynecology

## 2014-01-17 ENCOUNTER — Encounter (INDEPENDENT_AMBULATORY_CARE_PROVIDER_SITE_OTHER): Payer: Self-pay

## 2014-01-17 ENCOUNTER — Other Ambulatory Visit: Payer: Self-pay | Admitting: Obstetrics and Gynecology

## 2014-01-17 DIAGNOSIS — R928 Other abnormal and inconclusive findings on diagnostic imaging of breast: Secondary | ICD-10-CM

## 2014-02-02 ENCOUNTER — Ambulatory Visit
Admission: RE | Admit: 2014-02-02 | Discharge: 2014-02-02 | Disposition: A | Discharge status: Home / Self Care | Payer: BC Managed Care – PPO | Source: Ambulatory Visit | Attending: Obstetrics and Gynecology | Admitting: Obstetrics and Gynecology

## 2014-02-02 DIAGNOSIS — R928 Other abnormal and inconclusive findings on diagnostic imaging of breast: Secondary | ICD-10-CM

## 2014-09-03 ENCOUNTER — Other Ambulatory Visit (HOSPITAL_COMMUNITY): Payer: Self-pay | Admitting: Nurse Practitioner

## 2014-09-03 ENCOUNTER — Ambulatory Visit (HOSPITAL_COMMUNITY)
Admission: RE | Admit: 2014-09-03 | Discharge: 2014-09-03 | Disposition: A | Discharge status: Home / Self Care | Payer: BC Managed Care – PPO | Source: Ambulatory Visit | Attending: Surgery | Admitting: Surgery

## 2014-09-03 DIAGNOSIS — R609 Edema, unspecified: Secondary | ICD-10-CM | POA: Diagnosis not present

## 2015-02-07 ENCOUNTER — Other Ambulatory Visit: Payer: Self-pay | Admitting: Obstetrics and Gynecology

## 2015-02-08 LAB — CYTOLOGY - PAP

## 2015-03-06 ENCOUNTER — Other Ambulatory Visit: Payer: Self-pay | Admitting: Obstetrics and Gynecology

## 2015-03-06 DIAGNOSIS — Z809 Family history of malignant neoplasm, unspecified: Secondary | ICD-10-CM

## 2015-03-27 ENCOUNTER — Inpatient Hospital Stay: Admission: RE | Admit: 2015-03-27 | Payer: BC Managed Care – PPO | Source: Ambulatory Visit

## 2016-02-12 LAB — HM PAP SMEAR: HM Pap smear: NOT DETECTED

## 2016-02-12 LAB — HM MAMMOGRAPHY

## 2017-03-08 ENCOUNTER — Encounter: Payer: Self-pay | Admitting: Physician Assistant

## 2017-03-08 ENCOUNTER — Ambulatory Visit (INDEPENDENT_AMBULATORY_CARE_PROVIDER_SITE_OTHER): Payer: BC Managed Care – PPO | Admitting: Physician Assistant

## 2017-03-08 VITALS — BP 140/90 | HR 73 | Temp 98.0°F | Resp 18 | Ht 63.5 in | Wt 189.8 lb

## 2017-03-08 DIAGNOSIS — Z1212 Encounter for screening for malignant neoplasm of rectum: Secondary | ICD-10-CM

## 2017-03-08 DIAGNOSIS — I10 Essential (primary) hypertension: Secondary | ICD-10-CM

## 2017-03-08 DIAGNOSIS — Z1211 Encounter for screening for malignant neoplasm of colon: Secondary | ICD-10-CM

## 2017-03-08 MED ORDER — LOSARTAN POTASSIUM 50 MG PO TABS: 50.0000 mg | ORAL_TABLET | Freq: Every day | ORAL | 0 refills | Status: DC

## 2017-03-08 NOTE — Progress Notes (Signed)
Patient ID: Kristi Rush MRN: [id number], DOB: 1966/03/04, 51 y.o. Date of Encounter: @DATE @  Chief Complaint:  Chief Complaint  Patient presents with  . New Patient (Initial Visit)    HPI: 51 y.o. year old female  presents as a New Patient to Establish Care.  She states that she had been seeing Dr. Florina Rush until her office closed.  Then had established with an Kristi Rush but wasn't pleased and is just taken her time to finally get established here.  Reports that she has no known medical problems that have required any chronic meds.  Reports that the only medicine that she has prescribed is from orthopedics-- naproxen.  Says that she was running and fell on a cable in the ground.  Ultimately, found out she had several fractures in her foot but by the time the fractures were found they had already been healing. Had to wear a shoe. Had decreased activity during that time and has had significant weight gain. Also wearing that shoe/boot caused an imbalance and caused problems on the other side of her body.  Says that that injury was exactly one year ago -- was October 2017-- says all that's been going on for a year. Says that she sees a Dr. Telford Rush at [facility].  Reports that she does see her gynecologist annually. Sees Dr. Radene Rush. States that she has mammogram performed there in his office.  Reports that he checks her labs including thyroid and cholesterol etc.  She reports that she had migraines when she was in high school and college but then they subsided.  Reports that recently she has had some headaches but they have felt different than her past migraines.  Says that since noticing these headaches, she has been checking her blood pressure at home recently and has been getting elevated readings over the past 4-5 weeks. Getting systolic readings in the 474Q ---the highest has been 156. Diastolic readings are in the 80s, 90s and the highest has been 106.  No  other specific concerns to address today.  No other known medical history.  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   History reviewed. No pertinent past medical history.   Home Meds: Outpatient Medications Prior to Visit  Medication Sig Dispense Refill  . naproxen sodium (ANAPROX) 220 MG tablet Take 440 mg by mouth 2 (two) times daily as needed (pain).    . topiramate (TOPAMAX) 100 MG tablet Take 100 mg by mouth at bedtime.     No facility-administered medications prior to visit.     Allergies: No Known Allergies  Social History   Social History  . Marital status: Married    Spouse name: N/A  . Number of children: N/A  . Years of education: N/A   Occupational History  . Teacher [employer]    4th grade   Social History Main Topics  . Smoking status: Never Smoker  . Smokeless tobacco: Never Used  . Alcohol use No  . Drug use: No  . Sexual activity: Not on file   Other Topics Concern  . Not on file   Social History Narrative  . No narrative on file    Family History  Problem Relation Age of Onset  . Breast cancer Mother 76  . Diabetes Father      Review of Systems:  See HPI for pertinent ROS. All other ROS negative.    Physical Exam: Blood pressure 140/90, pulse 73, temperature 98 F (  36.7 C), temperature source Oral, resp. rate 18, height 5' 3.5" (1.613 m), weight 86.1 kg (189 lb 12.8 oz), SpO2 99 %., Body mass index is 33.09 kg/m. General: WF.  Appears in no acute distress. Neck: Supple. No thyromegaly. No lymphadenopathy. Lungs: Clear bilaterally to auscultation without wheezes, rales, or rhonchi. Breathing is unlabored. Heart: RRR with S1 S2. No murmurs, rubs, or gallops. Abdomen: Soft, non-tender, non-distended with normoactive bowel sounds. No hepatomegaly. No rebound/guarding. No obvious abdominal masses. Musculoskeletal:  Strength and tone normal for age. Extremities/Skin: Warm and dry.  No LE edema.  Neuro:  Alert and oriented X 3. Moves all extremities spontaneously. Gait is normal. CNII-XII grossly in tact. Psych:  Responds to questions appropriately with a normal affect.     ASSESSMENT AND PLAN:  51 y.o. year old female with  1. Essential hypertension Check baseline lab today. She will start losartan 51 mg daily. She will return for follow-up visit in 2 weeks to recheck BP and BMET on medication. - BASIC METABOLIC PANEL WITH GFR - losartan (COZAAR) 50 MG tablet; Take 1 tablet (50 mg total) by mouth daily.  Dispense: 30 tablet; Refill: 0  Today also discussed preventative care. She sees GYN Dr. Radene Rush annually. He does screening lab work. He does mammogram there in his office. Today I had patient sign a release form to get copy of mammogram. Discussed colonoscopy for colorectal cancer screening. She is agreeable to for me to refer her to GI for this. She states that she had a tetanus vaccine about 5 or 6 years ago. She does not want to get the flu vaccine. Aware of risk versus benefit but defers.  2. Screening for colorectal cancer - Ambulatory referral to Gastroenterology   Signed, Kristi Rush, [place], [facility] 03/08/2017 4:00 PM

## 2017-03-09 LAB — BASIC METABOLIC PANEL WITH GFR
BUN: 16 mg/dL (ref 7–25)
CO2: 27 mmol/L (ref 20–32)
Calcium: 9.5 mg/dL (ref 8.6–10.4)
Chloride: 106 mmol/L (ref 98–110)
Creat: 0.88 mg/dL (ref 0.50–1.05)
GFR, Est African American: 88 mL/min/{1.73_m2} (ref >=60)
GFR, Est Non African American: 76 mL/min/{1.73_m2} (ref >=60)
GLUCOSE: 90 mg/dL (ref 65–99)
POTASSIUM: 4.4 mmol/L (ref 3.5–5.3)
SODIUM: 142 mmol/L (ref 135–146)

## 2017-03-10 ENCOUNTER — Encounter (INDEPENDENT_AMBULATORY_CARE_PROVIDER_SITE_OTHER): Payer: Self-pay | Admitting: *Deleted

## 2017-03-22 ENCOUNTER — Ambulatory Visit: Payer: BC Managed Care – PPO | Admitting: Physician Assistant

## 2017-03-22 ENCOUNTER — Encounter: Payer: Self-pay | Admitting: Physician Assistant

## 2017-03-22 VITALS — BP 130/86 | HR 89 | Temp 97.8°F | Resp 14 | Ht 64.0 in | Wt 189.2 lb

## 2017-03-22 DIAGNOSIS — I10 Essential (primary) hypertension: Secondary | ICD-10-CM

## 2017-03-22 MED ORDER — LOSARTAN POTASSIUM 50 MG PO TABS: 50.0000 mg | ORAL_TABLET | Freq: Every day | ORAL | 1 refills | Status: DC

## 2017-03-22 NOTE — Progress Notes (Addendum)
Patient ID: Kristi Rush MRN: [id number], DOB: [birth year], 51 y.o. Date of Encounter: @DATE @  Chief Complaint:  Chief Complaint  Patient presents with  . 2 week follow up    HPI: 51 y.o. year old female     03/08/2017: presents as a New Patient to [facility].  She states that she had been seeing Dr. Florina Ou until her office closed.  Then had established with an Engineer, structural but wasn't pleased and is just taken her time to finally get established here.  Reports that she has no known medical problems that have required any chronic meds.  Reports that the only medicine that she has prescribed is from orthopedics-- naproxen.  Says that she was running and fell on a cable in the ground.  Ultimately, found out she had several fractures in her foot but by the time the fractures were found they had already been healing. Had to wear a shoe. Had decreased activity during that time and has had significant weight gain. Also wearing that shoe/boot caused an imbalance and caused problems on the other side of her body.  Says that that injury was exactly one year ago -- was October 2017-- says all that's been going on for a year. Says that she sees a Dr. Telford Nab at [facility].  Reports that she does see her gynecologist annually. Sees Dr. Radene Knee. States that she has mammogram performed there in his office.  Reports that he checks her labs including thyroid and cholesterol etc.  She reports that she had migraines when she was in high school and college but then they subsided.  Reports that recently she has had some headaches but they have felt different than her past migraines.  Says that since noticing these headaches, she has been checking her blood pressure at home recently and has been getting elevated readings over the past 4-5 weeks. Getting systolic readings in the 595G ---the highest has been 156. Diastolic readings are in the 80s, 90s and the highest has been  106.  No other specific concerns to address today.  No other known medical history.  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  A/P AT THAT OV: 1. Essential hypertension Check baseline lab today. She will start losartan 50 mg daily. She will return for follow-up visit in 2 weeks to recheck BP and BMET on medication. - BASIC METABOLIC PANEL WITH GFR - losartan (COZAAR) 50 MG tablet; Take 1 tablet (50 mg total) by mouth daily.  Dispense: 30 tablet; Refill: 0  Today also discussed preventative care. She sees GYN Dr. Radene Knee annually. He does screening lab work. He does mammogram there in his office. Today I had patient sign a release form to get copy of mammogram. Discussed colonoscopy for colorectal cancer screening. She is agreeable to for me to refer her to GI for this. She states that she had a tetanus vaccine about 5 or 6 years ago. She does not want to get the flu vaccine. Aware of risk versus benefit but defers.  2. Screening for colorectal cancer - Ambulatory referral to Gastroenterology    03/22/2017: She reports that she has been taking the losartan 50 mg daily.  This is causing no adverse effects.  Says that she has been taking her blood pressure at home and states that the readings do fluctuate.  States that 2 readings have been a little bit high but the rest of the readings have been good.  Se has no other  concerns to address today.     History reviewed. No pertinent past medical history.   Home Meds: Outpatient Medications Prior to Visit  Medication Sig Dispense Refill  . naproxen sodium (ANAPROX) 220 MG tablet Take 440 mg by mouth 2 (two) times daily as needed (pain).    Marland Kitchen losartan (COZAAR) 50 MG tablet Take 1 tablet (50 mg total) by mouth daily. 30 tablet 0   No facility-administered medications prior to visit.     Allergies: No Known Allergies  Social History   Socioeconomic History  . Marital status: Married    Spouse name: Not on  file  . Number of children: Not on file  . Years of education: Not on file  . Highest education level: Not on file  Social Needs  . Financial resource strain: Not on file  . Food insecurity - worry: Not on file  . Food insecurity - inability: Not on file  . Transportation needs - medical: Not on file  . Transportation needs - non-medical: Not on file  Occupational History  . 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 Smoking status: Never Smoker  . Smokeless tobacco: Never Used  Substance and Sexual Activity  . Alcohol use: No  . Drug use: No  . Sexual activity: Not on file  Other Topics Concern  . Not on file  Social History Narrative  . Not on file    Family History  Problem Relation Age of Onset  . Breast cancer Mother 38  . Diabetes Father      Review of Systems:  See HPI for pertinent ROS. All other ROS negative.    Physical Exam: Blood pressure 130/86, pulse 89, temperature 97.8 F (36.6 C), temperature source Oral, resp. rate 14, height 5\' 4"  (1.626 m), weight 85.8 kg (189 lb 3.2 oz), SpO2 98 %., Body mass index is 32.48 kg/m. General: WNWD WF.  Appears in no acute distress. Neck: Supple. No thyromegaly. No lymphadenopathy. Lungs: Clear bilaterally to auscultation without wheezes, rales, or rhonchi. Breathing is unlabored. Heart: RRR with S1 S2. No murmurs, rubs, or gallops. Musculoskeletal:  Strength and tone normal for age. Extremities/Skin: Warm and dry.  No LE edema.  Neuro: Alert and oriented X 3. Moves all extremities spontaneously. Gait is normal. CNII-XII grossly in tact. Psych:  Responds to questions appropriately with a normal affect.     ASSESSMENT AND PLAN:  51 y.o. year old female with   Essential hypertension Check lab today to assess lab on medication.. Will continue losartan 50 mg daily. We will continue to monitor blood pressure and document it the readings.  Consistently getting  systolic greater than 440 or diastolic greater than 90 then follow-up.  Otherwise if blood pressure remains controlled can wait 6 months for follow-up visit. - BASIC METABOLIC PANEL WITH GFR - losartan (COZAAR) 50 MG tablet; Take 1 tablet (50 mg total) by mouth daily.  Dispense: 90 tablet; Refill: 1  Preventive Care 03/08/2017: Today also discussed preventative care. She sees GYN Dr. Radene Knee annually. He does screening lab work. He does mammogram there in his office. Today I had patient sign a release form to get copy of mammogram. Discussed colonoscopy for colorectal cancer screening. She is agreeable to for me to refer her to GI for this. She states that she had a tetanus vaccine about 5 or 6 years ago. She does not want to get the flu vaccine. Aware of  risk versus benefit but defers.  Screening for colorectal cancer 03/08/2017: I placed order for referral to GI - Ambulatory referral to Gastroenterology 03/22/2017: She states that she has received notice from GI and will follow up with scheduling colonoscopy.   Addendum Added 03/29/2017:  Received records from her GYN Dr. Radene Knee. Pap smear 02/12/2016--- showed ASCUS.  HPV was negative. Pap smear 02/07/2015 was negative.  Cytology was negative for intraepithelial lesions or malignancy.  Mammogram 02/20/2016 showed no evidence of malignancy. Negative.    Routine follow-up visit 6 months.  Follow-up sooner if needed.  [address] [place], [place], [facility] 03/22/2017 4:38 PM

## 2017-03-23 LAB — BASIC METABOLIC PANEL WITH GFR
BUN: 16 mg/dL (ref 7–25)
CALCIUM: 9.4 mg/dL (ref 8.6–10.4)
CHLORIDE: 103 mmol/L (ref 98–110)
CO2: 31 mmol/L (ref 20–32)
Creat: 0.87 mg/dL (ref 0.50–1.05)
GFR, EST AFRICAN AMERICAN: 89 mL/min/{1.73_m2} (ref >=60)
GFR, Est Non African American: 77 mL/min/{1.73_m2} (ref >=60)
Glucose, Bld: 97 mg/dL (ref 65–99)
Potassium: 4.4 mmol/L (ref 3.5–5.3)
Sodium: 139 mmol/L (ref 135–146)

## 2017-04-13 ENCOUNTER — Other Ambulatory Visit: Payer: Self-pay | Admitting: Obstetrics and Gynecology

## 2017-04-13 DIAGNOSIS — Z803 Family history of malignant neoplasm of breast: Secondary | ICD-10-CM

## 2017-05-04 ENCOUNTER — Ambulatory Visit
Admission: RE | Admit: 2017-05-04 | Discharge: 2017-05-04 | Disposition: A | Discharge status: Home / Self Care | Payer: BC Managed Care – PPO | Source: Ambulatory Visit | Attending: Obstetrics and Gynecology | Admitting: Obstetrics and Gynecology

## 2017-05-04 DIAGNOSIS — Z803 Family history of malignant neoplasm of breast: Secondary | ICD-10-CM

## 2017-05-04 MED ORDER — GADOBENATE DIMEGLUMINE 529 MG/ML IV SOLN
17.0000 mL | Freq: Once | INTRAVENOUS | Status: AC | PRN
  Administered 2017-05-04: 17 mL via INTRAVENOUS

## 2017-05-21 ENCOUNTER — Other Ambulatory Visit (INDEPENDENT_AMBULATORY_CARE_PROVIDER_SITE_OTHER): Payer: Self-pay | Admitting: *Deleted

## 2017-05-21 DIAGNOSIS — Z800 Family history of malignant neoplasm of digestive organs: Secondary | ICD-10-CM | POA: Insufficient documentation

## 2017-05-21 DIAGNOSIS — Z1211 Encounter for screening for malignant neoplasm of colon: Secondary | ICD-10-CM

## 2017-05-27 ENCOUNTER — Ambulatory Visit: Payer: BC Managed Care – PPO | Admitting: Physician Assistant

## 2017-05-27 ENCOUNTER — Other Ambulatory Visit: Payer: Self-pay

## 2017-05-27 ENCOUNTER — Encounter: Payer: Self-pay | Admitting: Physician Assistant

## 2017-05-27 VITALS — BP 144/98 | HR 69 | Temp 97.8°F | Resp 18 | Ht 64.0 in | Wt 189.0 lb

## 2017-05-27 DIAGNOSIS — N951 Menopausal and female climacteric states: Secondary | ICD-10-CM | POA: Diagnosis not present

## 2017-05-27 DIAGNOSIS — I10 Essential (primary) hypertension: Secondary | ICD-10-CM | POA: Diagnosis not present

## 2017-05-27 DIAGNOSIS — G43909 Migraine, unspecified, not intractable, without status migrainosus: Secondary | ICD-10-CM | POA: Insufficient documentation

## 2017-05-27 DIAGNOSIS — G43009 Migraine without aura, not intractable, without status migrainosus: Secondary | ICD-10-CM | POA: Diagnosis not present

## 2017-05-27 MED ORDER — TOPIRAMATE 25 MG PO TABS: ORAL_TABLET | ORAL | 0 refills | Status: DC

## 2017-05-27 MED ORDER — SUMATRIPTAN SUCCINATE 100 MG PO TABS: 100.0000 mg | ORAL_TABLET | Freq: Once | ORAL | 2 refills | Status: DC | PRN

## 2017-05-27 MED ORDER — NEBIVOLOL HCL 5 MG PO TABS: 5.0000 mg | ORAL_TABLET | Freq: Every day | ORAL | 2 refills | Status: DC

## 2017-05-28 NOTE — Progress Notes (Signed)
Patient ID: ADAEZE BETTER MRN: [id number], DOB: 1965-06-21, 52 y.o. Date of Encounter: @DATE @  Chief Complaint:  Chief Complaint  Patient presents with  . blood pressure recheck  . left leg knot  . puffiness around neck area    HPI: 52 y.o. year old female     03/08/2017: presents as a New Patient to [place].  She states that she had been seeing Dr. Florina Ou until her office closed.  Then had established with an Engineer, structural but wasn't pleased and is just taken her time to finally get established here.  Reports that she has no known medical problems that have required any chronic meds.  Reports that the only medicine that she has prescribed is from orthopedics-- naproxen.  Says that she was running and fell on a cable in the ground.  Ultimately, found out she had several fractures in her foot but by the time the fractures were found they had already been healing. Had to wear a shoe. Had decreased activity during that time and has had significant weight gain. Also wearing that shoe/boot caused an imbalance and caused problems on the other side of her body.  Says that that injury was exactly one year ago -- was October 2017-- says all that's been going on for a year. Says that she sees a Dr. Telford Nab at [facility].  Reports that she does see her gynecologist annually. Sees Dr. Radene Knee. States that she has mammogram performed there in his office.  Reports that he checks her labs including thyroid and cholesterol etc.  She reports that she had migraines when she was in high school and college but then they subsided.  Reports that recently she has had some headaches but they have felt different than her past migraines.  Says that since noticing these headaches, she has been checking her blood pressure at home recently and has been getting elevated readings over the past 4-5 weeks. Getting systolic readings in the 240X ---the highest has been 156. Diastolic  readings are in the 80s, 90s and the highest has been 106.  No other specific concerns to address today.  No other known medical history.  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  A/P AT THAT OV: 1. Essential hypertension Check baseline lab today. She will start losartan 50 mg daily. She will return for follow-up visit in 2 weeks to recheck BP and BMET on medication. - BASIC METABOLIC PANEL WITH GFR - losartan (COZAAR) 50 MG tablet; Take 1 tablet (50 mg total) by mouth daily.  Dispense: 30 tablet; Refill: 0  Today also discussed preventative care. She sees GYN Dr. Radene Knee annually. He does screening lab work. He does mammogram there in his office. Today I had patient sign a release form to get copy of mammogram. Discussed colonoscopy for colorectal cancer screening. She is agreeable to for me to refer her to GI for this. She states that she had a tetanus vaccine about 5 or 6 years ago. She does not want to get the flu vaccine. Aware of risk versus benefit but defers.  2. Screening for colorectal cancer - Ambulatory referral to Gastroenterology    03/22/2017: She reports that she has been taking the losartan 50 mg daily.  This is causing no adverse effects.  Says that she has been taking her blood pressure at home and states that the readings do fluctuate.  States that 2 readings have been a little bit high but the rest of  the readings have been good.  Se has no other concerns to address today. AT THAT OV:  Started Losartan 50mg  QD   05/27/2017: Since LOV, Losartan was recalled.  However, she says she had stopped the medication prior to the recall. Says she 'felt bad", felt tired when taking it.  Says she has been feeling drained, tired, moody.  Has been having some headaches---had thought headache may be related to BP being high---but really has not seen change in headaches when on BP med (BP lower) and when off BP med (BP higher) Says she had migraines in  high school then for period of time they resolved. They returned in her 52s. "Took Topamax forever". Had no adverse effects with that -- just eventually got off med b/c was having no headaches. Also had imitrex in the past.  Discussed that these symptoms may be related to perimenopause.  She reports that at recent Gyn visit they did labs and told her labs c/w perimenopause. (She says she had Gyn procedure years ago and has had no bleeding since) Has checked BP some and getting some high readings since off losartan.     History reviewed. No pertinent past medical history.   Home Meds: Outpatient Medications Prior to Visit  Medication Sig Dispense Refill  . naproxen sodium (ANAPROX) 220 MG tablet Take 440 mg by mouth 2 (two) times daily as needed (pain).    Marland Kitchen losartan (COZAAR) 50 MG tablet Take 1 tablet (50 mg total) daily by mouth. (Patient not taking: Reported on 05/27/2017) 90 tablet 1   No facility-administered medications prior to visit.     Allergies: No Known Allergies  Social History   Socioeconomic History  . Marital status: Married    Spouse name: Not on file  . Number of children: Not on file  . Years of education: Not on file  . Highest education level: Not on file  Social Needs  . Financial resource strain: Not on file  . Food insecurity - worry: Not on file  . Food insecurity - inability: Not on file  . Transportation needs - medical: Not on file  . Transportation needs - non-medical: Not on file  Occupational History  . 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 Smoking status: Never Smoker  . Smokeless tobacco: Never Used  Substance and Sexual Activity  . Alcohol use: No  . Drug use: No  . Sexual activity: Not on file  Other Topics Concern  . Not on file  Social History Narrative  . Not on file    Family History  Problem Relation Age of Onset  . Breast cancer Mother 6  . Diabetes Father       Review of Systems:  See HPI for pertinent ROS. All other ROS negative.    Physical Exam: Blood pressure (!) 144/98, pulse 69, temperature 97.8 F (36.6 C), temperature source Oral, resp. rate 18, height 5\' 4"  (1.626 m), weight 85.7 kg (189 lb), SpO2 99 %., Body mass index is 32.44 kg/m. General: WNWD WF.  Appears in no acute distress. Neck: Supple. No thyromegaly. No lymphadenopathy. Lungs: Clear bilaterally to auscultation without wheezes, rales, or rhonchi. Breathing is unlabored. Heart: RRR with S1 S2. No murmurs, rubs, or gallops. Musculoskeletal:  Strength and tone normal for age. Extremities/Skin: Warm and dry.  No LE edema.  Neuro: Alert and oriented X 3. Moves all extremities spontaneously. Gait is normal. CNII-XII grossly  in tact. Psych:  Responds to questions appropriately with a normal affect.     ASSESSMENT AND PLAN:  53 y.o. year old female with   1. Essential hypertension Given migraines, Bystolic should be good fit for her. Gave her 2 sample bottles. She can monitor BP. If BP is good and she is having no adverse effects, cna fil Rx for # 90 ---savings card given. - nebivolol (BYSTOLIC) 5 MG tablet; Take 1 tablet (5 mg total) by mouth daily.  Dispense: 90 tablet; Refill: 2  2. Migraine without aura and without status migrainosus, not intractable Will add back Imitrex and Topamax--to titrate dose as directed. - SUMAtriptan (IMITREX) 100 MG tablet; Take 1 tablet (100 mg total) by mouth once as needed for migraine. May repeat in 2 hours if headache persists or recurs.  Dispense: 30 tablet; Refill: 2 - topiramate (TOPAMAX) 25 MG tablet; Take one at night for 1 week then Take one twice a day for 1 week then Take one in the morning, two at night for 1 week then Take 2 twice a day  Dispense: 60 tablet; Refill: 0  3. Perimenopause We discussed that perimenopause likely contributing to her symptoms.     -----------THE FOLLOWING IS COPIED FORM OV 03/08/2017---NOT  ADDRESSED AT OV 05/27/2017:----------------------------------   Preventive Care 03/08/2017: Today also discussed preventative care. She sees GYN Dr. Radene Knee annually. He does screening lab work. He does mammogram there in his office. Today I had patient sign a release form to get copy of mammogram. Discussed colonoscopy for colorectal cancer screening. She is agreeable to for me to refer her to GI for this. She states that she had a tetanus vaccine about 5 or 6 years ago. She does not want to get the flu vaccine. Aware of risk versus benefit but defers.  Screening for colorectal cancer 03/08/2017: I placed order for referral to GI - Ambulatory referral to Gastroenterology 03/22/2017: She states that she has received notice from GI and will follow up with scheduling colonoscopy.   Addendum Added 03/29/2017:  Received records from her GYN Dr. Radene Knee. Pap smear 02/12/2016--- showed ASCUS.  HPV was negative. Pap smear 02/07/2015 was negative.  Cytology was negative for intraepithelial lesions or malignancy.  Mammogram 02/20/2016 showed no evidence of malignancy. Negative.    Routine follow-up visit 6 months.  Follow-up sooner if needed.  [address] [place], [place], [facility] 05/28/2017 3:15 PM

## 2017-06-19 ENCOUNTER — Other Ambulatory Visit: Payer: Self-pay | Admitting: Physician Assistant

## 2017-06-19 DIAGNOSIS — G43009 Migraine without aura, not intractable, without status migrainosus: Secondary | ICD-10-CM

## 2017-06-21 NOTE — Telephone Encounter (Signed)
Refill appropriate 

## 2017-07-02 ENCOUNTER — Other Ambulatory Visit: Payer: Self-pay | Admitting: Physician Assistant

## 2017-07-02 DIAGNOSIS — G43009 Migraine without aura, not intractable, without status migrainosus: Secondary | ICD-10-CM

## 2017-07-07 ENCOUNTER — Telehealth: Payer: Self-pay

## 2017-07-07 DIAGNOSIS — G43009 Migraine without aura, not intractable, without status migrainosus: Secondary | ICD-10-CM

## 2017-07-07 MED ORDER — TOPIRAMATE 25 MG PO TABS: ORAL_TABLET | ORAL | 0 refills | Status: DC

## 2017-07-07 MED ORDER — TOPIRAMATE 50 MG PO TABS: 50.0000 mg | ORAL_TABLET | Freq: Two times a day (BID) | ORAL | 5 refills | Status: DC

## 2017-07-07 NOTE — Telephone Encounter (Signed)
New rx sent to pharmacy

## 2017-07-07 NOTE — Telephone Encounter (Signed)
I reviewed her office note from 05/27/17.   At that time-- prescribed Topamax 25 mg and she was to titrate up dose.   Now is up to taking 2 of those twice daily. At this point send in Topamax 50 mg----1 p.o. twice daily #60+5 refills

## 2017-07-07 NOTE — Telephone Encounter (Signed)
Patient states she is now taking  2 tablets twice a day so the quantity needs to be 120. Will make the change

## 2017-09-14 ENCOUNTER — Telehealth (INDEPENDENT_AMBULATORY_CARE_PROVIDER_SITE_OTHER): Payer: Self-pay | Admitting: *Deleted

## 2017-09-14 ENCOUNTER — Encounter (INDEPENDENT_AMBULATORY_CARE_PROVIDER_SITE_OTHER): Payer: Self-pay | Admitting: *Deleted

## 2017-09-14 MED ORDER — PEG 3350-KCL-NA BICARB-NACL 420 G PO SOLR: 4000.0000 mL | Freq: Once | ORAL | 0 refills | Status: AC

## 2017-09-14 NOTE — Telephone Encounter (Signed)
Patient needs trilyte 

## 2017-10-04 ENCOUNTER — Telehealth (INDEPENDENT_AMBULATORY_CARE_PROVIDER_SITE_OTHER): Payer: Self-pay | Admitting: *Deleted

## 2017-10-04 NOTE — Telephone Encounter (Signed)
agree

## 2017-10-04 NOTE — Telephone Encounter (Signed)
Referring MD/PCP: mary Meddings -- [facility]   Procedure: tcs  Reason/Indication:  Screening, fam hx colon ca  Has patient had this procedure before?  Yes, over 10 yrs ago  If so, when, by whom and where?    Is there a family history of colon cancer?  Yes, father  Who?  What age when diagnosed?    Is patient diabetic?   no      Does patient have prosthetic heart valve or mechanical valve?  no  Do you have a pacemaker?  no  Has patient ever had endocarditis? no  Has patient had joint replacement within last 12 months?  no  Is patient constipated or do they take laxatives? no  Does patient have a history of alcohol/drug use?  no  Is patient on blood thinner such as Coumadin, Plavix and/or Aspirin? no  Medications: losartan 50 mg daily  Allergies: nkda  Medication Adjustment per Dr Lindi Adie, NP:   Procedure date & time: 11/03/17 at 730

## 2017-10-22 ENCOUNTER — Encounter (INDEPENDENT_AMBULATORY_CARE_PROVIDER_SITE_OTHER): Payer: Self-pay | Admitting: *Deleted

## 2017-11-15 ENCOUNTER — Telehealth (INDEPENDENT_AMBULATORY_CARE_PROVIDER_SITE_OTHER): Payer: Self-pay | Admitting: *Deleted

## 2017-11-15 NOTE — Telephone Encounter (Signed)
Referring MD/PCP: mary Siemen -- [facility]   Procedure: tcs  Reason/Indication:  Screening, fam hx colon ca  Has patient had this procedure before?  Yes, over 10 yrs ago             If so, when, by whom and where?    Is there a family history of colon cancer?  Yes, father             Who?  What age when diagnosed?    Is patient diabetic?   no                                                  Does patient have prosthetic heart valve or mechanical valve?  no  Do you have a pacemaker?  no  Has patient ever had endocarditis? no  Has patient had joint replacement within last 12 months?  no  Is patient constipated or do they take laxatives? no  Does patient have a history of alcohol/drug use?  no  Is patient on blood thinner such as Coumadin, Plavix and/or Aspirin? no  Medications: losartan 50 mg daily  Allergies: nkda  Medication Adjustment per Dr Lindi Adie, NP:   Procedure date & time: 12/15/17 at 930

## 2017-11-15 NOTE — Telephone Encounter (Signed)
agree

## 2017-12-01 ENCOUNTER — Other Ambulatory Visit: Payer: Self-pay

## 2017-12-01 ENCOUNTER — Encounter: Payer: Self-pay | Admitting: Physician Assistant

## 2017-12-01 ENCOUNTER — Ambulatory Visit: Payer: BC Managed Care – PPO | Admitting: Physician Assistant

## 2017-12-01 VITALS — BP 130/84 | HR 52 | Temp 98.3°F | Resp 16 | Ht 62.0 in | Wt 186.0 lb

## 2017-12-01 DIAGNOSIS — R221 Localized swelling, mass and lump, neck: Secondary | ICD-10-CM

## 2017-12-01 DIAGNOSIS — I10 Essential (primary) hypertension: Secondary | ICD-10-CM

## 2017-12-01 DIAGNOSIS — R1310 Dysphagia, unspecified: Secondary | ICD-10-CM | POA: Diagnosis not present

## 2017-12-01 DIAGNOSIS — G43009 Migraine without aura, not intractable, without status migrainosus: Secondary | ICD-10-CM | POA: Diagnosis not present

## 2017-12-01 MED ORDER — SUMATRIPTAN SUCCINATE 100 MG PO TABS: 100.0000 mg | ORAL_TABLET | Freq: Once | ORAL | 2 refills | Status: DC | PRN

## 2017-12-01 MED ORDER — TOPIRAMATE 50 MG PO TABS: 50.0000 mg | ORAL_TABLET | Freq: Two times a day (BID) | ORAL | 5 refills | Status: DC

## 2017-12-01 MED ORDER — NEBIVOLOL HCL 5 MG PO TABS: 5.0000 mg | ORAL_TABLET | Freq: Every day | ORAL | 2 refills | Status: DC

## 2017-12-01 NOTE — Progress Notes (Signed)
Patient ID: Kristi Rush MRN: [id number], DOB: November 17, 1965, 52 y.o. Date of Encounter: @DATE @  Chief Complaint:  Chief Complaint  Patient presents with  . swelling in left foot  . throat pain  . Medication Refill    HPI: 52 y.o. year old female     03/08/2017: presents as a New Patient to [place].  She states that she had been seeing Dr. Florina Ou until her office closed.  Then had established with an Engineer, structural but wasn't pleased and is just taken her time to finally get established here.  Reports that she has no known medical problems that have required any chronic meds.  Reports that the only medicine that she has prescribed is from orthopedics-- naproxen.  Says that she was running and fell on a cable in the ground.  Ultimately, found out she had several fractures in her foot but by the time the fractures were found they had already been healing. Had to wear a shoe. Had decreased activity during that time and has had significant weight gain. Also wearing that shoe/boot caused an imbalance and caused problems on the other side of her body.  Says that that injury was exactly one year ago -- was October 2017-- says all that's been going on for a year. Says that she sees a Dr. Telford Nab at [facility].  Reports that she does see her gynecologist annually. Sees Dr. Radene Knee. States that she has mammogram performed there in his office.  Reports that he checks her labs including thyroid and cholesterol etc.  She reports that she had migraines when she was in high school and college but then they subsided.  Reports that recently she has had some headaches but they have felt different than her past migraines.  Says that since noticing these headaches, she has been checking her blood pressure at home recently and has been getting elevated readings over the past 4-5 weeks. Getting systolic readings in the 637C ---the highest has been 156. Diastolic readings are in  the 80s, 90s and the highest has been 106.  No other specific concerns to address today.  No other known medical history.  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  A/P AT THAT OV: 1. Essential hypertension Check baseline lab today. She will start losartan 50 mg daily. She will return for follow-up visit in 2 weeks to recheck BP and BMET on medication. - BASIC METABOLIC PANEL WITH GFR - losartan (COZAAR) 50 MG tablet; Take 1 tablet (50 mg total) by mouth daily.  Dispense: 30 tablet; Refill: 0  Today also discussed preventative care. She sees GYN Dr. Radene Knee annually. He does screening lab work. He does mammogram there in his office. Today I had patient sign a release form to get copy of mammogram. Discussed colonoscopy for colorectal cancer screening. She is agreeable to for me to refer her to GI for this. She states that she had a tetanus vaccine about 5 or 6 years ago. She does not want to get the flu vaccine. Aware of risk versus benefit but defers.  2. Screening for colorectal cancer - Ambulatory referral to Gastroenterology    03/22/2017: She reports that she has been taking the losartan 50 mg daily.  This is causing no adverse effects.  Says that she has been taking her blood pressure at home and states that the readings do fluctuate.  States that 2 readings have been a little bit high but the rest of the readings  have been good.  Se has no other concerns to address today. AT THAT OV:  Started Losartan 50mg  QD   05/27/2017: Since LOV, Losartan was recalled.  However, she says she had stopped the medication prior to the recall. Says she 'felt bad", felt tired when taking it.  Says she has been feeling drained, tired, moody.  Has been having some headaches---had thought headache may be related to BP being high---but really has not seen change in headaches when on BP med (BP lower) and when off BP med (BP higher) Says she had migraines in high school then  for period of time they resolved. They returned in her 25s. "Took Topamax forever". Had no adverse effects with that -- just eventually got off med b/c was having no headaches. Also had imitrex in the past.  Discussed that these symptoms may be related to perimenopause.  She reports that at recent Gyn visit they did labs and told her labs c/w perimenopause. (She says she had Gyn procedure years ago and has had no bleeding since) Has checked BP some and getting some high readings since off losartan.     12/01/2017: She reports that recently she has been having difficulty swallowing.  Sometimes feels like her food is getting stuck.  Also sometimes feels like there is still something there like she has to clear her throat especially after she eats. She also states that her mom has told her that the right side of her neck seems to protrude a little more than the left and thinks that there is a little bit more fullness on that right side.  Patient states that she has not felt any mass there and has felt no painful areas.  She also reports that she has been having problems with her left foot.  Says that there is an area in the foot that on certain days will appear swollen and will be painful.  States that she has been seeing Dr. Sheliah Plane foot and that they have obtained orthotics.  However she is still having problems with that foot so wanted to get my input regarding that.  She also is due to get refill on her blood pressure medication. She continues to take the [place] daily.  It is causing no lightheadedness or other adverse effects.  No other specific concerns to address today.    History reviewed. No pertinent past medical history.   Home Meds: Outpatient Medications Prior to Visit  Medication Sig Dispense Refill  . nebivolol (BYSTOLIC) 5 MG tablet Take 1 tablet (5 mg total) by mouth daily. 90 tablet 2  . SUMAtriptan (IMITREX) 100 MG tablet Take 1 tablet (100 mg total) by mouth once as needed  for migraine. May repeat in 2 hours if headache persists or recurs. 30 tablet 2  . topiramate (TOPAMAX) 50 MG tablet Take 1 tablet (50 mg total) by mouth 2 (two) times daily. Please d/c 25mg  60 tablet 5  . naproxen sodium (ANAPROX) 220 MG tablet Take 440 mg by mouth 2 (two) times daily as needed (pain).     No facility-administered medications prior to visit.     Allergies: No Known Allergies  Social History   Socioeconomic History  . Marital status: Married    Spouse name: Not on file  . Number of children: Not on file  . Years of education: Not on file  . Highest education level: Not on file  Occupational History  . 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 Financial resource strain: Not on file  . Food insecurity:    Worry: Not on file    Inability: Not on file  . Transportation needs:    Medical: Not on file    Non-medical: Not on file  Tobacco Use  . Smoking status: Never Smoker  . Smokeless tobacco: Never Used  Substance and Sexual Activity  . Alcohol use: No  . Drug use: No  . Sexual activity: Not on file  Lifestyle  . Physical activity:    Days per week: Not on file    Minutes per session: Not on file  . Stress: Not on file  Relationships  . Social connections:    Talks on phone: Not on file    Gets together: Not on file    Attends religious service: Not on file    Active member of club or organization: Not on file    Attends meetings of clubs or organizations: Not on file    Relationship status: Not on file  . Intimate partner violence:    Fear of current or ex partner: Not on file    Emotionally abused: Not on file    Physically abused: Not on file    Forced sexual activity: Not on file  Other Topics Concern  . Not on file  Social History Narrative  . Not on file    Family History  Problem Relation Age of Onset  . Breast cancer Mother 60  . Diabetes Father      Review of Systems:  See HPI  for pertinent ROS. All other ROS negative.    Physical Exam: Blood pressure 130/84, pulse (!) 52, temperature 98.3 F (36.8 C), temperature source Oral, resp. rate 16, height 5\' 2"  (1.575 m), weight 84.4 kg (186 lb), SpO2 99 %., Body mass index is 34.02 kg/m. General: WF. Appears in no acute distress. Head: Normocephalic, atraumatic, eyes without discharge, sclera non-icteric, nares are without discharge.  Oral cavity moist, posterior pharynx without exudate, erythema, peritonsillar abscess, or post nasal drip. Oral mucosa and posterior pharynx appear normal.  I am unable to visualize any abnormality on inspection.  Neck: Supple. No thyromegaly. No lymphadenopathy.  I have palpated her neck and I am palpating no palpable mass or lymphadenopathy. Lungs: Clear bilaterally to auscultation without wheezes, rales, or rhonchi. Breathing is unlabored. Heart: RRR with S1 S2. No murmurs, rubs, or gallops. Musculoskeletal:  Strength and tone normal for age. Extremities/Skin: Warm and dry.  Neuro: Alert and oriented X 3. Moves all extremities spontaneously. Gait is normal. CNII-XII grossly in tact. Psych:  Responds to questions appropriately with a normal affect.      ASSESSMENT AND PLAN:  52 y.o. year old female with    1. Dysphagia, unspecified type Refer to GI for evaluation of possible esophageal stricture. Will obtain ultrasound soft tissue neck to evaluate for any type of mass contributing to her symptoms of dysphasia and given that patient reports that her mom has told her that she thinks the right side of her neck appears a little more full than the left.  I am noticing no increased fullness on the right compared to left today and I am palpating no mass or lymphadenopathy. - Ambulatory referral to Gastroenterology - US SOFT TISSUE NECK; Future  2. Neck mass Will obtain ultrasound soft tissue neck to evaluate for any type of mass contributing to her symptoms of dysphasia and given that  patient reports that her  mom has told her that she thinks the right side of her neck appears a little more full than the left.  I am noticing no increased fullness on the right compared to left today and I am palpating no mass or lymphadenopathy. - US SOFT TISSUE NECK; Future  3. Essential hypertension Blood Pressure is controlled.  Continue current medication. - nebivolol (BYSTOLIC) 5 MG tablet; Take 1 tablet (5 mg total) by mouth daily.  Dispense: 90 tablet; Refill: 2        -----------THE FOLLOWING IS COPIED FORM OV 03/08/2017---NOT ADDRESSED AT OV 05/27/2017:----------------------------------   Preventive Care 03/08/2017: Today also discussed preventative care. She sees GYN Dr. Radene Knee annually. He does screening lab work. He does mammogram there in his office. Today I had patient sign a release form to get copy of mammogram. Discussed colonoscopy for colorectal cancer screening. She is agreeable to for me to refer her to GI for this. She states that she had a tetanus vaccine about 5 or 6 years ago. She does not want to get the flu vaccine. Aware of risk versus benefit but defers.  Screening for colorectal cancer 03/08/2017: I placed order for referral to GI - Ambulatory referral to Gastroenterology 03/22/2017: She states that she has received notice from GI and will follow up with scheduling colonoscopy.   Addendum Added 03/29/2017:  Received records from her GYN Dr. Radene Knee. Pap smear 02/12/2016--- showed ASCUS.  HPV was negative. Pap smear 02/07/2015 was negative.  Cytology was negative for intraepithelial lesions or malignancy.  Mammogram 02/20/2016 showed no evidence of malignancy. Negative.    Routine follow-up visit 6 months.  Follow-up sooner if needed.  [address] [place], [place], [facility] 12/01/2017 2:33 PM

## 2017-12-08 ENCOUNTER — Ambulatory Visit (HOSPITAL_COMMUNITY)
Admission: RE | Admit: 2017-12-08 | Discharge: 2017-12-08 | Disposition: A | Discharge status: Home / Self Care | Payer: BC Managed Care – PPO | Source: Ambulatory Visit | Attending: Physician Assistant | Admitting: Physician Assistant

## 2017-12-08 DIAGNOSIS — R221 Localized swelling, mass and lump, neck: Secondary | ICD-10-CM

## 2017-12-08 DIAGNOSIS — R1310 Dysphagia, unspecified: Secondary | ICD-10-CM | POA: Insufficient documentation

## 2018-01-20 ENCOUNTER — Encounter (INDEPENDENT_AMBULATORY_CARE_PROVIDER_SITE_OTHER): Payer: Self-pay | Admitting: *Deleted

## 2018-01-21 IMAGING — MR MR BILATERAL BREAST WITHOUT AND WITH CONTRAST
8 of 12 series · 33 of 48 positions shown · IV contrast (17ml Multihance)
Comparison: Previous mammography and breast ultrasound. No prior
breast MRIs.

CLINICAL DATA: High risk screening breast MRI. Patient with a
family history, mother diagnosed in her 70s, grandmother diagnosed
in her 60s. Multiple aunts also diagnosed with breast carcinoma.
Lifetime risk calculated at 23.5 percent.

LABS:  No labs drawn at time of imaging
EXAM:
BILATERAL BREAST MRI WITH AND WITHOUT CONTRAST
TECHNIQUE: Multiplanar, multisequence MR images of both breasts were obtained
prior to and following the intravenous administration of 17 ml of
MultiHance.

[Series 2: t2_tirm_tra ipat (a-p) · axial · 3.0mm · 0.70mm/px · 1 of 55 slices shown]
[im 1/55]
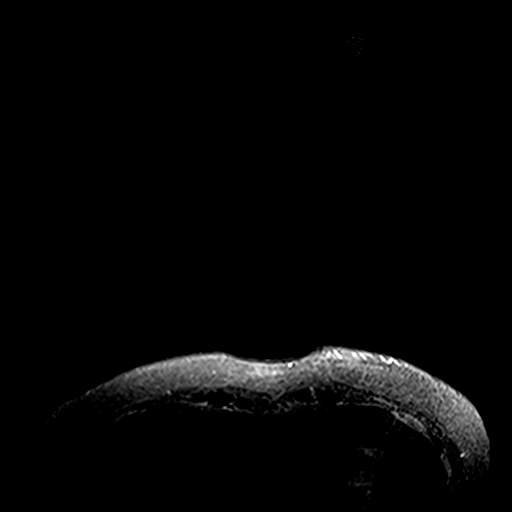

[Series 3: fl3d pre-cm no · axial · non-contrast · 1.2mm · 0.94mm/px · z∈[-76,+95]mm · 5 of 144 slices shown]
[im 1/144]
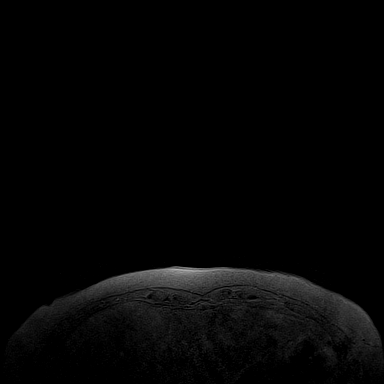
[im 36/144]
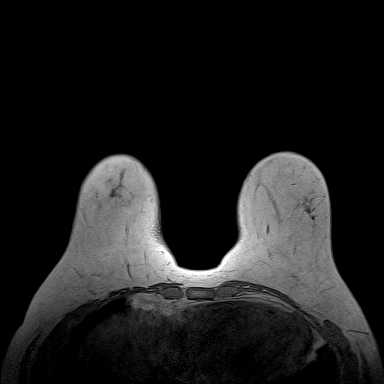
[im 72/144]
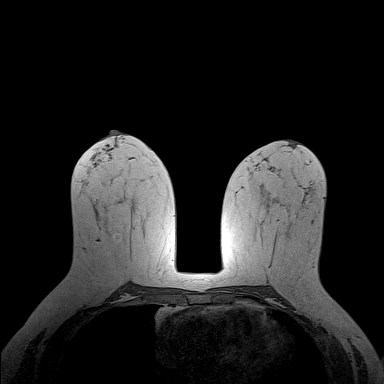
[im 108/144]
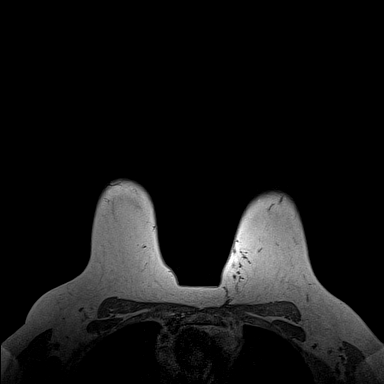
[im 144/144]
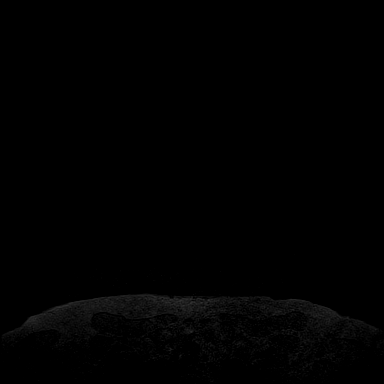

[Series 4: fl3d pre-cm · axial · non-contrast · 1.2mm · 0.94mm/px · z∈[-76,+95]mm · 5 of 144 slices shown]
[im 1/144]
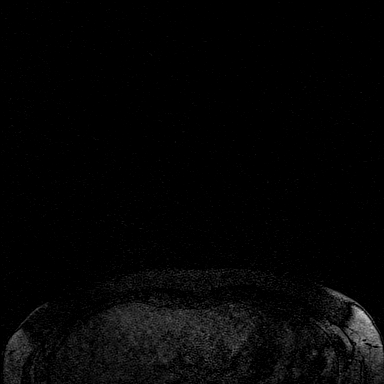
[im 36/144]
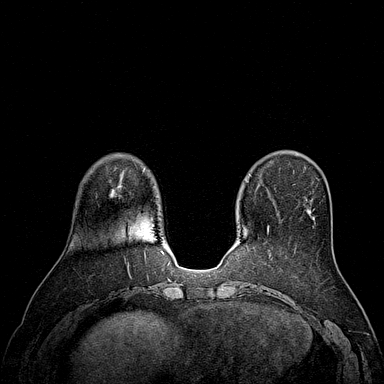
[im 72/144]
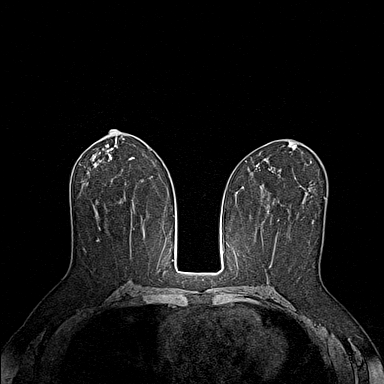
[im 108/144]
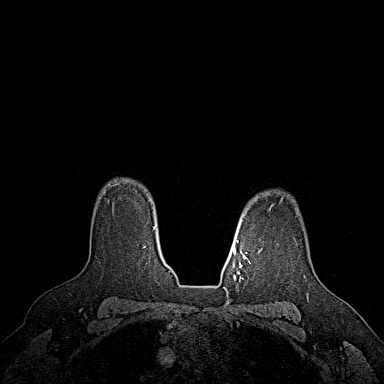
[im 144/144]
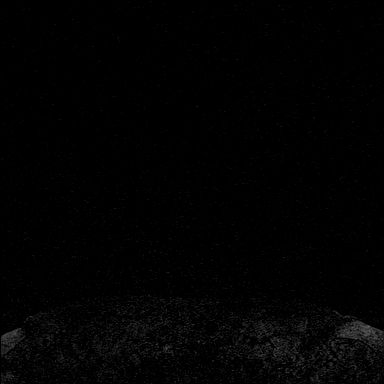

[Series 5: fl3d post-cm 20 · axial · 1.2mm · 0.94mm/px · z∈[-76,+95]mm · 5 of 144 slices shown (1 of 3)]
[im 1/144]
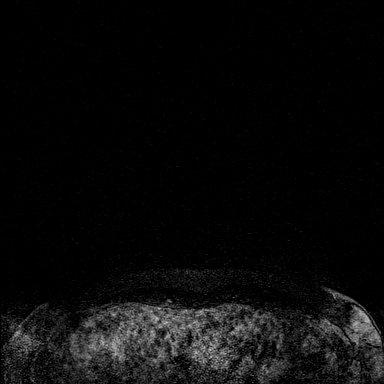
[im 36/144]
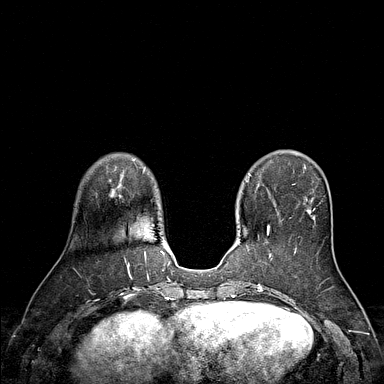
[im 72/144]
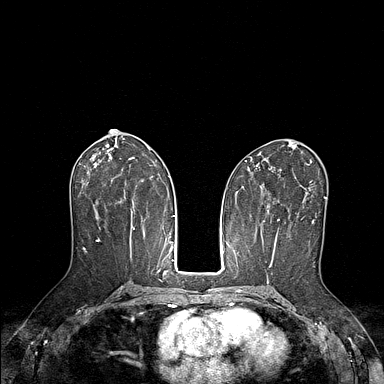
[im 108/144]
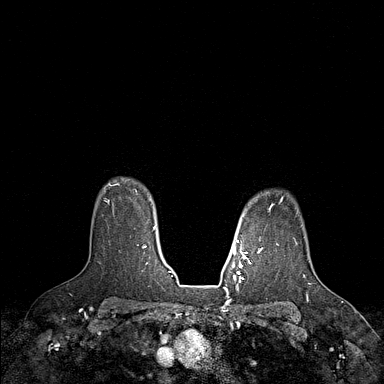
[im 144/144]
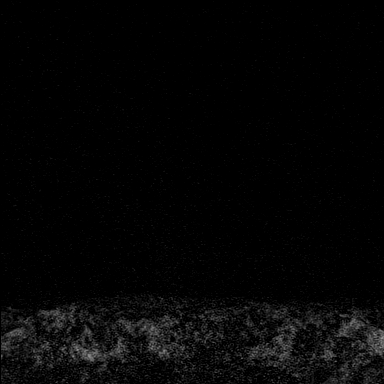

[Series 6: fl3d post-cm 20 · axial · 1.2mm · 0.94mm/px · z∈[-76,+95]mm · 5 of 144 slices shown (2 of 3)]
[im 1/144]
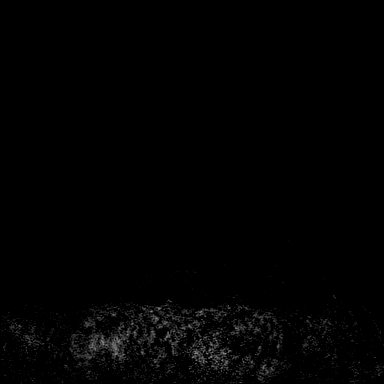
[im 36/144]
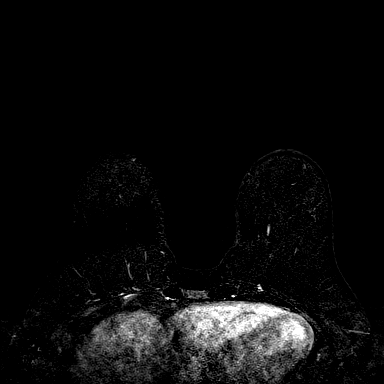
[im 72/144]
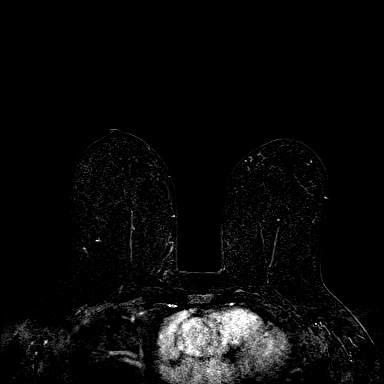
[im 108/144]
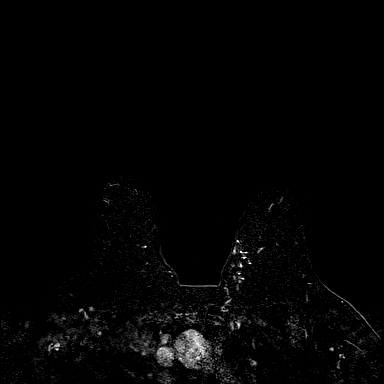
[im 144/144]
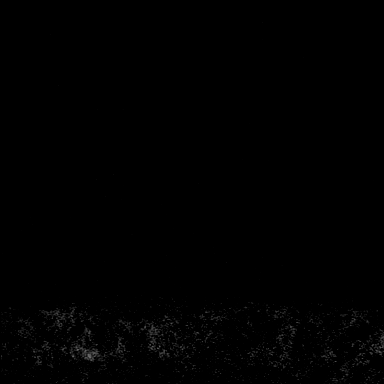

[Series 7: fl3d post-cm 20 · axial · 172.8mm · 0.94mm/px · 1 of 1 slices shown (3 of 3)]
[im 1/1]
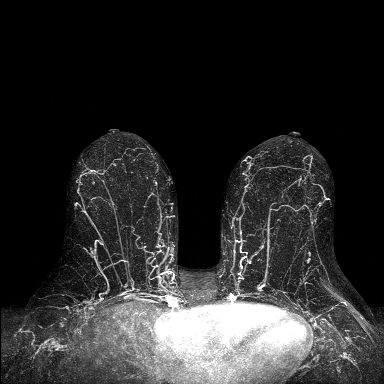

[Series 8: fl3d post-cm 3min · axial · 1.2mm · 0.94mm/px · z∈[-76,+95]mm · 6 of 144 slices shown]
[im 1/144]
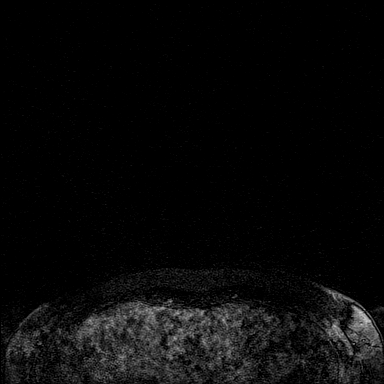
[im 29/144]
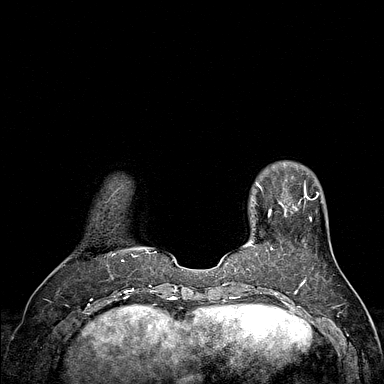
[im 58/144]
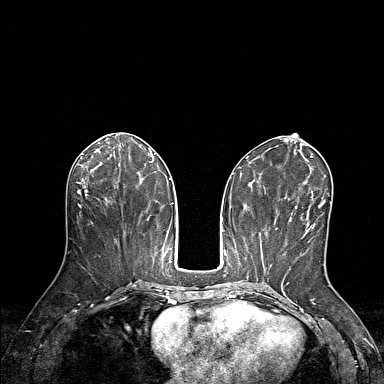
[im 86/144]
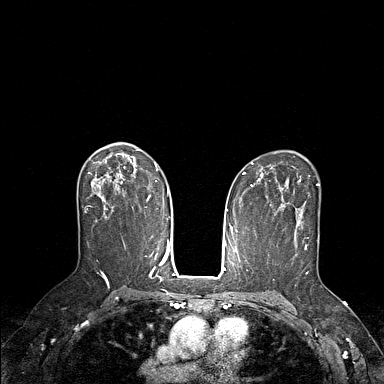
[im 115/144]
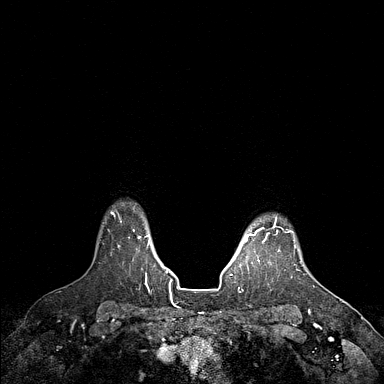
[im 144/144]
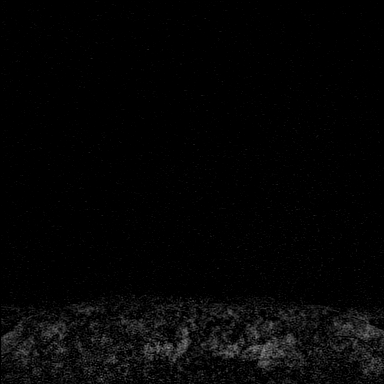

[Series 9: fl3d post-cm 3min_sub · axial · 1.2mm · 0.94mm/px · z∈[-76,+60]mm · 5 of 144 slices shown]
[im 1/144]
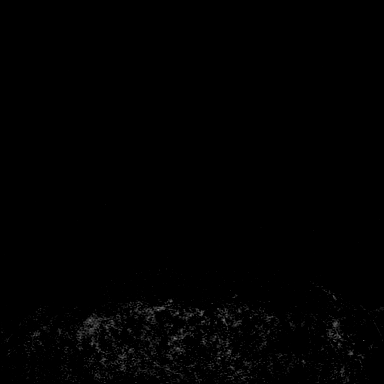
[im 29/144]
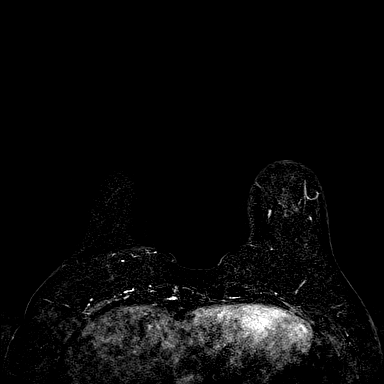
[im 58/144]
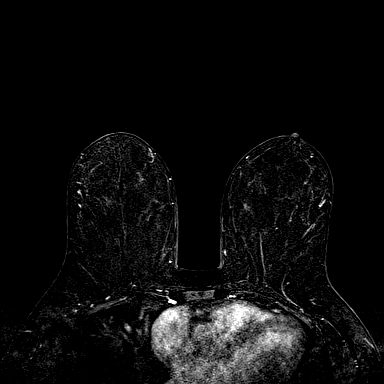
[im 86/144]
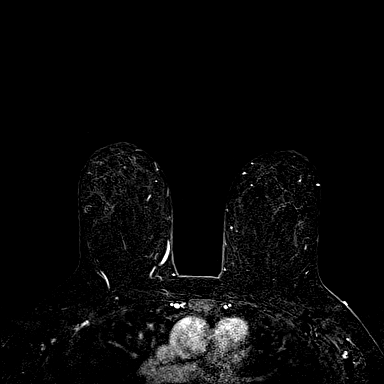
[im 115/144]
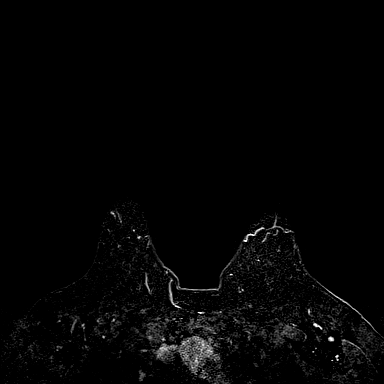

[33 of 48 positions shown; findings below may reference images not displayed]

THREE-DIMENSIONAL MR IMAGE RENDERING ON INDEPENDENT WORKSTATION:

Three-dimensional MR images were rendered by post-processing of the
original MR data on an independent workstation. The
three-dimensional MR images were interpreted, and findings are
reported in the following complete MRI report for this study. Three
dimensional images were evaluated at the independent [employer]
workstation
FINDINGS: Breast composition: b. Scattered fibroglandular tissue.

Background parenchymal enhancement: Mild

Right breast: No mass or abnormal enhancement. Artifact from a
biopsy clip is noted in the posterior, central right breast.

Left breast: No mass or abnormal enhancement.

Lymph nodes: No abnormal appearing lymph nodes.

Ancillary findings:  None.
IMPRESSION: 1. No evidence of breast malignancy.

RECOMMENDATION:
Screening mammogram in one year.(Code:[id number])

Consider annual or biannual screening breast MRI given the patient's
elevated lifetime risk for developing breast carcinoma.

BI-RADS CATEGORY  1: Negative.

## 2018-02-03 ENCOUNTER — Telehealth (INDEPENDENT_AMBULATORY_CARE_PROVIDER_SITE_OTHER): Payer: Self-pay | Admitting: *Deleted

## 2018-02-03 NOTE — Telephone Encounter (Signed)
Referring MD/PCP:mary Nida -- [facility]   Procedure:tcs  Reason/Indication:Screening, fam hx colon ca  Has patient had this procedure before?Yes, over 10 yrs ago If so, when, by whom and where?   Is there a family history of colon cancer?Yes, father Who? What age when diagnosed?   Is patient diabetic?no  Does patient have prosthetic heart valveor mechanical valve?no  Do you have a pacemaker?no  Has patient ever had endocarditis?no  Has patient had joint replacement within last 12 months?no  Is patient constipated or do they take laxatives?no  Does patient have a history of alcohol/drug use?no  Is patient onblood thinner such as Coumadin, Plavix and/or Aspirin? no  Medications:losartan 50 mg daily  Allergies:nkda  Medication Adjustment per Dr Lindi Adie, NP:   Procedure date & time: 02/23/18 at 830

## 2018-02-03 NOTE — Telephone Encounter (Signed)
agree

## 2018-02-23 ENCOUNTER — Encounter (HOSPITAL_COMMUNITY)
Admission: RE | Disposition: A | Discharge status: Home / Self Care | Payer: Self-pay | Source: Ambulatory Visit | Attending: Internal Medicine

## 2018-02-23 ENCOUNTER — Encounter (HOSPITAL_COMMUNITY): Payer: Self-pay

## 2018-02-23 ENCOUNTER — Other Ambulatory Visit: Payer: Self-pay

## 2018-02-23 ENCOUNTER — Ambulatory Visit (HOSPITAL_COMMUNITY)
Admission: RE | Admit: 2018-02-23 | Discharge: 2018-02-23 | Disposition: A | Discharge status: Home / Self Care | Payer: BC Managed Care – PPO | Source: Ambulatory Visit | Attending: Internal Medicine | Admitting: Internal Medicine

## 2018-02-23 DIAGNOSIS — Z803 Family history of malignant neoplasm of breast: Secondary | ICD-10-CM | POA: Insufficient documentation

## 2018-02-23 DIAGNOSIS — Z800 Family history of malignant neoplasm of digestive organs: Secondary | ICD-10-CM

## 2018-02-23 DIAGNOSIS — K648 Other hemorrhoids: Secondary | ICD-10-CM | POA: Diagnosis not present

## 2018-02-23 DIAGNOSIS — Z79899 Other long term (current) drug therapy: Secondary | ICD-10-CM | POA: Diagnosis not present

## 2018-02-23 DIAGNOSIS — K5730 Diverticulosis of large intestine without perforation or abscess without bleeding: Secondary | ICD-10-CM | POA: Insufficient documentation

## 2018-02-23 DIAGNOSIS — Z1211 Encounter for screening for malignant neoplasm of colon: Secondary | ICD-10-CM | POA: Diagnosis present

## 2018-02-23 HISTORY — PX: COLONOSCOPY: SHX5424

## 2018-02-23 HISTORY — DX: Headache, unspecified: R51.9

## 2018-02-23 HISTORY — DX: Essential (primary) hypertension: I10

## 2018-02-23 HISTORY — DX: Headache: R51

## 2018-02-23 SURGERY — COLONOSCOPY
Anesthesia: Moderate Sedation

## 2018-02-23 MED ORDER — MEPERIDINE HCL 50 MG/ML IJ SOLN
INTRAMUSCULAR | Status: AC
  Filled 2018-02-23: qty 1

## 2018-02-23 MED ORDER — MEPERIDINE HCL 50 MG/ML IJ SOLN
INTRAMUSCULAR | Status: DC | PRN
  Administered 2018-02-23 (×2): 25 mg via INTRAVENOUS

## 2018-02-23 MED ORDER — MIDAZOLAM HCL 5 MG/5ML IJ SOLN
INTRAMUSCULAR | Status: DC | PRN
  Administered 2018-02-23 (×2): 2 mg via INTRAVENOUS
  Administered 2018-02-23: 1 mg via INTRAVENOUS
  Administered 2018-02-23: 2 mg via INTRAVENOUS

## 2018-02-23 MED ORDER — SODIUM CHLORIDE 0.9 % IV SOLN
INTRAVENOUS | Status: DC
  Administered 2018-02-23: 08:00:00 via INTRAVENOUS

## 2018-02-23 MED ORDER — MIDAZOLAM HCL 5 MG/5ML IJ SOLN
INTRAMUSCULAR | Status: AC
  Filled 2018-02-23: qty 10

## 2018-02-23 MED ORDER — STERILE WATER FOR IRRIGATION IR SOLN
Status: DC | PRN
  Administered 2018-02-23: 08:00:00

## 2018-02-23 NOTE — Discharge Instructions (Signed)
Resume usual medications as before. High-fiber diet. No driving for 24 hours. Next screening exam in 10 years.  PATIENT INSTRUCTIONS POST-ANESTHESIA  IMMEDIATELY FOLLOWING SURGERY:  Do not drive or operate machinery for the first twenty four hours after surgery.  Do not make any important decisions for twenty four hours after surgery or while taking narcotic pain medications or sedatives.  If you develop intractable nausea and vomiting or a severe headache please notify your doctor immediately.  FOLLOW-UP:  Please make an appointment with your surgeon as instructed. You do not need to follow up with anesthesia unless specifically instructed to do so.  WOUND CARE INSTRUCTIONS (if applicable):  Keep a dry clean dressing on the anesthesia/puncture wound site if there is drainage.  Once the wound has quit draining you may leave it open to air.  Generally you should leave the bandage intact for twenty four hours unless there is drainage.  If the epidural site drains for more than 36-48 hours please call the anesthesia department.  QUESTIONS?:  Please feel free to call your physician or the hospital operator if you have any questions, and they will be happy to assist you.       Colonoscopy, Adult, Care After This sheet gives you information about how to care for yourself after your procedure. Your health care provider may also give you more specific instructions. If you have problems or questions, contact your health care provider. What can I expect after the procedure? After the procedure, it is common to have:  A small amount of blood in your stool for 24 hours after the procedure.  Some gas.  Mild abdominal cramping or bloating.  Follow these instructions at home: General instructions   For the first 24 hours after the procedure: ? Do not drive or use machinery. ? Do not sign important documents. ? Do not drink alcohol. ? Do your regular daily activities at a slower pace than  normal. ? Eat soft, easy-to-digest foods. ? Rest often.  Take over-the-counter or prescription medicines only as told by your health care provider.  It is up to you to get the results of your procedure. Ask your health care provider, or the department performing the procedure, when your results will be ready. Relieving cramping and bloating  Try walking around when you have cramps or feel bloated.  Apply heat to your abdomen as told by your health care provider. Use a heat source that your health care provider recommends, such as a moist heat pack or a heating pad. ? Place a towel between your skin and the heat source. ? Leave the heat on for 20-30 minutes. ? Remove the heat if your skin turns bright red. This is especially important if you are unable to feel pain, heat, or cold. You may have a greater risk of getting burned. Eating and drinking  Drink enough fluid to keep your urine clear or pale yellow.  Resume your normal diet as instructed by your health care provider. Avoid heavy or fried foods that are hard to digest.  Avoid drinking alcohol for as long as instructed by your health care provider. Contact a health care provider if:  You have blood in your stool 2-3 days after the procedure. Get help right away if:  You have more than a small spotting of blood in your stool.  You pass large blood clots in your stool.  Your abdomen is swollen.  You have nausea or vomiting.  You have a fever.  You  have increasing abdominal pain that is not relieved with medicine. This information is not intended to replace advice given to you by your health care provider. Make sure you discuss any questions you have with your health care provider. Document Released: 12/17/2003 Document Revised: 01/27/2016 Document Reviewed: 07/16/2015 Elsevier Interactive Patient Education  2018 [employer].    Diverticulosis Diverticulosis is a condition that develops when small pouches (diverticula)  form in the wall of the large intestine (colon). The colon is where water is absorbed and stool is formed. The pouches form when the inside layer of the colon pushes through weak spots in the outer layers of the colon. You may have a few pouches or many of them. What are the causes? The cause of this condition is not known. What increases the risk? The following factors may make you more likely to develop this condition:  Being older than age 56. Your risk for this condition increases with age. Diverticulosis is rare among people younger than age 42. By age 26, many people have it.  Eating a low-fiber diet.  Having frequent constipation.  Being overweight.  Not getting enough exercise.  Smoking.  Taking over-the-counter pain medicines, like aspirin and ibuprofen.  Having a family history of diverticulosis.  What are the signs or symptoms? In most people, there are no symptoms of this condition. If you do have symptoms, they may include:  Bloating.  Cramps in the abdomen.  Constipation or diarrhea.  Pain in the lower left side of the abdomen.  How is this diagnosed? This condition is most often diagnosed during an exam for other colon problems. Because diverticulosis usually has no symptoms, it often cannot be diagnosed independently. This condition may be diagnosed by:  Using a flexible scope to examine the colon (colonoscopy).  Taking an X-ray of the colon after dye has been put into the colon (barium enema).  Doing a CT scan.  How is this treated? You may not need treatment for this condition if you have never developed an infection related to diverticulosis. If you have had an infection before, treatment may include:  Eating a high-fiber diet. This may include eating more fruits, vegetables, and grains.  Taking a fiber supplement.  Taking a live bacteria supplement (probiotic).  Taking medicine to relax your colon.  Taking antibiotic medicines.  Follow these  instructions at home:  Drink 6-8 glasses of water or more each day to prevent constipation.  Try not to strain when you have a bowel movement.  If you have had an infection before: ? Eat more fiber as directed by your health care provider or your diet and nutrition specialist (dietitian). ? Take a fiber supplement or probiotic, if your health care provider approves.  Take over-the-counter and prescription medicines only as told by your health care provider.  If you were prescribed an antibiotic, take it as told by your health care provider. Do not stop taking the antibiotic even if you start to feel better.  Keep all follow-up visits as told by your health care provider. This is important. Contact a health care provider if:  You have pain in your abdomen.  You have bloating.  You have cramps.  You have not had a bowel movement in 3 days. Get help right away if:  Your pain gets worse.  Your bloating becomes very bad.  You have a fever or chills, and your symptoms suddenly get worse.  You vomit.  You have bowel movements that are bloody or  black.  You have bleeding from your rectum. Summary  Diverticulosis is a condition that develops when small pouches (diverticula) form in the wall of the large intestine (colon).  You may have a few pouches or many of them.  This condition is most often diagnosed during an exam for other colon problems.  If you have had an infection related to diverticulosis, treatment may include increasing the fiber in your diet, taking supplements, or taking medicines. This information is not intended to replace advice given to you by your health care provider. Make sure you discuss any questions you have with your health care provider. Document Released: 01/30/2004 Document Revised: 03/23/2016 Document Reviewed: 03/23/2016 Elsevier Interactive Patient Education  2017 [place].   Hemorrhoids Hemorrhoids are swollen veins in and around the  rectum or anus. Hemorrhoids can cause pain, itching, or bleeding. Most of the time, they do not cause serious problems. They usually get better with diet changes, lifestyle changes, and other home treatments. Follow these instructions at home: Eating and drinking  Eat foods that have fiber, such as whole grains, beans, nuts, fruits, and vegetables. Ask your doctor about taking products that have added fiber (fibersupplements).  Drink enough fluid to keep your pee (urine) clear or pale yellow. For Pain and Swelling  Take a warm-water bath (sitz bath) for 20 minutes to ease pain. Do this 3-4 times a day.  If directed, put ice on the painful area. It may be helpful to use ice between your warm baths. ? Put ice in a plastic bag. ? Place a towel between your skin and the bag. ? Leave the ice on for 20 minutes, 2-3 times a day. General instructions  Take over-the-counter and prescription medicines only as told by your doctor. ? Medicated creams and medicines that are inserted into the anus (suppositories) may be used or applied as told.  Exercise often.  Go to the bathroom when you have the urge to poop (to have a bowel movement). Do not wait.  Avoid pushing too hard (straining) when you poop.  Keep the butt area dry and clean. Use wet toilet paper or moist paper towels.  Do not sit on the toilet for a long time. Contact a doctor if:  You have any of these: ? Pain and swelling that do not get better with treatment or medicine. ? Bleeding that will not stop. ? Trouble pooping or you cannot poop. ? Pain or swelling outside the area of the hemorrhoids. This information is not intended to replace advice given to you by your health care provider. Make sure you discuss any questions you have with your health care provider. Document Released: 02/11/2008 Document Revised: 10/10/2015 Document Reviewed: 01/16/2015 Elsevier Interactive Patient Education  2018 [place].    High-Fiber  Diet Fiber, also called dietary fiber, is a type of carbohydrate found in fruits, vegetables, whole grains, and beans. A high-fiber diet can have many health benefits. Your health care provider may recommend a high-fiber diet to help:  Prevent constipation. Fiber can make your bowel movements more regular.  Lower your cholesterol.  Relieve hemorrhoids, uncomplicated diverticulosis, or irritable bowel syndrome.  Prevent overeating as part of a weight-loss plan.  Prevent heart disease, type 2 diabetes, and certain cancers.  What is my plan? The recommended daily intake of fiber includes:  38 grams for men under age 38.  21 grams for men over age 44.  73 grams for women under age 52.  21 grams for women over  age 74.  You can get the recommended daily intake of dietary fiber by eating a variety of fruits, vegetables, grains, and beans. Your health care provider may also recommend a fiber supplement if it is not possible to get enough fiber through your diet. What do I need to know about a high-fiber diet?  Fiber supplements have not been widely studied for their effectiveness, so it is better to get fiber through food sources.  Always check the fiber content on thenutrition facts label of any prepackaged food. Look for foods that contain at least 5 grams of fiber per serving.  Ask your dietitian if you have questions about specific foods that are related to your condition, especially if those foods are not listed in the following section.  Increase your daily fiber consumption gradually. Increasing your intake of dietary fiber too quickly may cause bloating, cramping, or gas.  Drink plenty of water. Water helps you to digest fiber. What foods can I eat? Grains Whole-grain breads. Multigrain cereal. Oats and oatmeal. Brown rice. Barley. Bulgur wheat. [place]. Bran muffins. Popcorn. Rye wafer crackers. Vegetables Sweet potatoes. Spinach. Kale. Artichokes. Cabbage. Broccoli. Green  peas. Carrots. Squash. Fruits Berries. Pears. Apples. Oranges. Avocados. Prunes and raisins. Dried figs. Meats and Other Protein Sources Navy, kidney, pinto, and soy beans. Split peas. Lentils. Nuts and seeds. Dairy Fiber-fortified yogurt. Beverages Fiber-fortified soy milk. Fiber-fortified orange juice. Other Fiber bars. The items listed above may not be a complete list of recommended foods or beverages. Contact your dietitian for more options. What foods are not recommended? Grains White bread. Pasta made with refined flour. White rice. Vegetables Fried potatoes. Canned vegetables. Well-cooked vegetables. Fruits Fruit juice. Cooked, strained fruit. Meats and Other Protein Sources Fatty cuts of meat. Fried Sales executive or fried fish. Dairy Milk. Yogurt. Cream cheese. Sour cream. Beverages Soft drinks. Other Cakes and pastries. Butter and oils. The items listed above may not be a complete list of foods and beverages to avoid. Contact your dietitian for more information. What are some tips for including high-fiber foods in my diet?  Eat a wide variety of high-fiber foods.  Make sure that half of all grains consumed each day are whole grains.  Replace breads and cereals made from refined flour or white flour with whole-grain breads and cereals.  Replace white rice with brown rice, bulgur wheat, or millet.  Start the day with a breakfast that is high in fiber, such as a cereal that contains at least 5 grams of fiber per serving.  Use beans in place of meat in soups, salads, or pasta.  Eat high-fiber snacks, such as berries, raw vegetables, nuts, or popcorn. This information is not intended to replace advice given to you by your health care provider. Make sure you discuss any questions you have with your health care provider. Document Released: 05/04/2005 Document Revised: 10/10/2015 Document Reviewed: 10/17/2013 Elsevier Interactive Patient Education  [employer].

## 2018-02-23 NOTE — Op Note (Signed)
[facility] Patient Name: Kristi Rush Procedure Date: 02/23/2018 8:13 AM MRN: [id number] Date of Birth: 10-Jun-1965 Attending MD: Hildred Laser , MD CSN: [id number] Age: 52 Admit Type: Outpatient Procedure:                Colonoscopy Indications:              Screening for colorectal malignant neoplasm,                            Screening in patient at increased risk: Colorectal                            cancer in father 83 or older Providers:                Hildred Laser, MD, Otis Peak B. Sharon Seller, RN, Nelma Rothman, Technician Referring MD:              Medicines:                Meperidine 50 mg IV, Midazolam 7 mg IV Complications:            No immediate complications. Estimated Blood Loss:     Estimated blood loss: none. Procedure:                Pre-Anesthesia Assessment:                           - Prior to the procedure, a History and Physical                            was performed, and patient medications and                            allergies were reviewed. The patient's tolerance of                            previous anesthesia was also reviewed. The risks                            and benefits of the procedure and the sedation                            options and risks were discussed with the patient.                            All questions were answered, and informed consent                            was obtained. Prior Anticoagulants: The patient has                            taken no previous anticoagulant or antiplatelet  agents. ASA Grade Assessment: II - A patient with                            mild systemic disease. After reviewing the risks                            and benefits, the patient was deemed in                            satisfactory condition to undergo the procedure.                           After obtaining informed consent, the colonoscope                            was passed under  direct vision. Throughout the                            procedure, the patient's blood pressure, pulse, and                            oxygen saturations were monitored continuously. The                            [id number]-[id number] ([id number]) scope was introduced through                            the anus and advanced to the the cecum, identified                            by appendiceal orifice and ileocecal valve. The                            colonoscopy was performed without difficulty. The                            patient tolerated the procedure well. The quality                            of the bowel preparation was excellent. The                            ileocecal valve, appendiceal orifice, and rectum                            were photographed. Scope In: 8:23:33 AM Scope Out: 8:39:19 AM Scope Withdrawal Time: 0 hours 7 minutes 16 seconds  Total Procedure Duration: 0 hours 15 minutes 46 seconds  Findings:      The perianal and digital rectal examinations were normal.      Scattered medium-mouthed diverticula were found in the sigmoid colon,       hepatic flexure and ascending colon.      Internal hemorrhoids were found during retroflexion. The hemorrhoids       were small. Impression:               -  Diverticulosis in the sigmoid colon, at the                            hepatic flexure and in the ascending colon.                           - Internal hemorrhoids.                           - No specimens collected. Moderate Sedation:      Moderate (conscious) sedation was administered by the endoscopy nurse       and supervised by the endoscopist. The following parameters were       monitored: oxygen saturation, heart rate, blood pressure, CO2       capnography and response to care. Total physician intraservice time was       22 minutes. Recommendation:           - Patient has a contact number available for                            emergencies. The signs and symptoms of  potential                            delayed complications were discussed with the                            patient. Return to normal activities tomorrow.                            Written discharge instructions were provided to the                            patient.                           - High fiber diet today.                           - Continue present medications.                           - Repeat colonoscopy in 10 years for screening                            purposes. Procedure Code(s):        --- Professional ---                           [id number], Colonoscopy, flexible; diagnostic, including                            collection of specimen(s) by brushing or washing,                            when performed (separate procedure)  G0500, Moderate sedation services provided by the                            same physician or other qualified health care                            professional performing a gastrointestinal                            endoscopic service that sedation supports,                            requiring the presence of an independent trained                            observer to assist in the monitoring of the                            patient's level of consciousness and physiological                            status; initial 15 minutes of intra-service time;                            patient age 5 years or older (additional time may                            be reported with [phone], as appropriate) Diagnosis Code(s):        --- Professional ---                           Z12.11, Encounter for screening for malignant                            neoplasm of colon                           Z80.0, Family history of malignant neoplasm of                            digestive organs                           K64.8, Other hemorrhoids                           K57.30, Diverticulosis of large intestine without                             perforation or abscess without bleeding CPT copyright 2018 [employer]. All rights reserved. The codes documented in this report are preliminary and upon coder review may  be revised to meet current compliance requirements. Hildred Laser, MD Hildred Laser, MD 02/23/2018 8:49:17 AM This report has been signed electronically. Number of Addenda: 0

## 2018-02-23 NOTE — H&P (Signed)
Kristi Rush is an 52 y.o. female.   Chief Complaint: Patient is here for colonoscopy. HPI: This 52 year old Caucasian female who is here for screening colonoscopy.  She denies abdominal pain change in bowel habits or rectal bleeding. She underwent diagnostic colonoscopy several years ago and reportedly was normal. Family history significant for CRC and father at late onset.  He was 65 at the time of diagnosis and is doing fine 20 years later.  Past Medical History:  Diagnosis Date  . Headache   . Hypertension     Past Surgical History:  Procedure Laterality Date  . WISDOM TOOTH EXTRACTION     many year ago    Family History  Problem Relation Age of Onset  . Breast cancer Mother 31  . Diabetes Father   . Colon cancer Father    Social History:  reports that she has never smoked. She has never used smokeless tobacco. She reports that she does not drink alcohol or use drugs.  Allergies: No Known Allergies  Medications Prior to Admission  Medication Sig Dispense Refill  . nebivolol (BYSTOLIC) 5 MG tablet Take 1 tablet (5 mg total) by mouth daily. 90 tablet 2  . Omega-3 Fatty Acids (FISH OIL) 1000 MG CAPS Take 1,000 mg by mouth daily.    Marland Kitchen topiramate (TOPAMAX) 50 MG tablet Take 1 tablet (50 mg total) by mouth 2 (two) times daily. Please d/c 25mg  (Patient taking differently: Take 50 mg by mouth 2 (two) times daily. ) 60 tablet 5  . naproxen sodium (ANAPROX) 220 MG tablet Take 440 mg by mouth 2 (two) times daily as needed (pain).    . SUMAtriptan (IMITREX) 100 MG tablet Take 1 tablet (100 mg total) by mouth once as needed for migraine. May repeat in 2 hours if headache persists or recurs. 30 tablet 2    No results found for this or any previous visit (from the past 48 hour(s)). No results found.  ROS  Blood pressure 129/68, pulse (!) 56, temperature 98.7 F (37.1 C), temperature source Oral, resp. rate 18, height 5\' 2"  (1.575 m), weight 81.6 kg, SpO2 100 %. Physical Exam   Constitutional: She appears well-developed and well-nourished.  HENT:  Mouth/Throat: Oropharynx is clear and moist.  Eyes: Conjunctivae are normal. No scleral icterus.  Neck: No thyromegaly present.  Cardiovascular: Normal rate, regular rhythm and normal heart sounds.  No murmur heard. Respiratory: Effort normal and breath sounds normal.  GI: Soft. She exhibits no distension and no mass. There is no tenderness.  Musculoskeletal: She exhibits no edema.  Lymphadenopathy:    She has no cervical adenopathy.  Neurological: She is alert.  Skin: Skin is warm and dry.     Assessment/Plan Average risk screening colonoscopy. Family history of CRC in first-degree relative at late onset.  Hildred Laser, MD 02/23/2018, 8:14 AM

## 2018-03-01 ENCOUNTER — Encounter (HOSPITAL_COMMUNITY): Payer: Self-pay | Admitting: Internal Medicine

## 2018-04-18 ENCOUNTER — Other Ambulatory Visit: Payer: Self-pay | Admitting: Obstetrics and Gynecology

## 2018-04-18 DIAGNOSIS — Z803 Family history of malignant neoplasm of breast: Secondary | ICD-10-CM

## 2018-07-06 ENCOUNTER — Encounter: Payer: Self-pay | Admitting: Family Medicine

## 2018-07-06 ENCOUNTER — Ambulatory Visit: Payer: BC Managed Care – PPO | Admitting: Family Medicine

## 2018-07-06 VITALS — BP 122/70 | HR 46 | Temp 97.7°F | Resp 16 | Ht 62.0 in | Wt 184.4 lb

## 2018-07-06 DIAGNOSIS — L659 Nonscarring hair loss, unspecified: Secondary | ICD-10-CM | POA: Diagnosis not present

## 2018-07-06 DIAGNOSIS — I10 Essential (primary) hypertension: Secondary | ICD-10-CM | POA: Diagnosis not present

## 2018-07-06 DIAGNOSIS — L608 Other nail disorders: Secondary | ICD-10-CM

## 2018-07-06 DIAGNOSIS — G43009 Migraine without aura, not intractable, without status migrainosus: Secondary | ICD-10-CM

## 2018-07-06 MED ORDER — TOPIRAMATE 50 MG PO TABS: 50.0000 mg | ORAL_TABLET | Freq: Two times a day (BID) | ORAL | 0 refills | Status: DC

## 2018-07-06 MED ORDER — EFINACONAZOLE 10 % EX SOLN: 1.0000 "application " | Freq: Every day | CUTANEOUS | 11 refills | Status: DC

## 2018-07-06 NOTE — Assessment & Plan Note (Signed)
Well controlled with bystolic, at goal today, no SE Pt is bradycardic, but not symptomatic Refill 30 d supply - sample given with manufacturer card Check CMP Recheck in ~ 6 months pt due for CPE

## 2018-07-06 NOTE — Assessment & Plan Note (Signed)
Well controlled with topimax BID and imitrex PRN - rarely uses She has some concerns about hair thinning from topimax - checking labs re topimax, if labs are normal, may switch to another preventative med.

## 2018-07-06 NOTE — Progress Notes (Signed)
Patient ID: Kristi Rush, female    DOB: 09-29-1965, 53 y.o.   MRN: [id number]  PCP: Delsa Grana, PA-C  Chief Complaint  Patient presents with  . Hypertension    Patient came in for a 6 month follow up. Has concerns of nails being brittle, and toenail coming off and hair changes.    Subjective:   Kristi Rush is a 53 y.o. female, presents to clinic with CC of HTN, migraines - needs med refill and f/up for both, and additionally is concerned with hair loss, thinning and change in health and texture over the past 4 months and she's also noticed some nail changes and spontaneously lost her right great toenail.    BP - monitors at home, Systolic usually 774-128, diastolic sometimes 78-676, sensitive to salt intake and diastolic increases.  She denies any lightheadedness, dizziness, CP, SOB, fatigue, LE edema.  Bystolic has become more expensive for her and she requests 30 d supply.  Although the price is higher, about $50/month, she does not really want to switch to another blood pressure medicine because is been very effective  Over the past 3-4 months she has noticed a lot of her hair breaking into her [place] and her hairdresser has mentioned to her that her hair is change dramatically she is asked if she has burned it or do anything to damage it she is also had to change her hair cut completely because of the thinning and breaking hair.  She wants to check her thyroid.  And shes concerned that her nails have changed too, thinner, more brittle. She endorses "slow bowels" but she was told it is her norm, and she has not noticed any different in the past couple months.  She denies any weight gain, fatigue or excessive sleeping, cold intolerance, peripheral edema, constipation, slowed thinking or depressed mood.  Her diet, sleep, exercise, work and home life have not changed very much at all in the last 6-12 months and she considers herself fairly health conscious.  She does not eat any red meat,  but otherwise has a very balanced diet states she eats a lot of fish, poultry and fruits and vegetables.  Additionally she takes some kind of special supplements that she says supposedly to combines dozens of fruits and vegetables into a pill that she takes daily.  She has never gotten very good sleep but that is her normal.    She asked for refill on Topamax and she has not of Imitrex.  When she first established with Karis Juba her PA that just left the practice, a few years ago she had she started to have frequent headaches and she had a history of migraines when she was younger.  She states that she would get several headaches a week and that Topamax has manage them very effectively 50 mg twice a day and that she rarely uses her Imitrex.  She is concerned that Topamax may be causing some of her symptoms.  She has not tried any other preventative migraine meds in the past.  She has never suffered from anemia.  She is postmenopausal.    Patient Active Problem List   Diagnosis Date Noted  . Migraine 05/27/2017  . Special screening for malignant neoplasms, colon 05/21/2017  . Family hx of colon cancer 05/21/2017  . Essential hypertension 03/08/2017  . Polyp of gallbladder 09/11/2013  . Chronic cholecystitis with sludge/calculus 09/11/2013  . Obesity (BMI 30-39.9) 09/11/2013  . Constipation, chronic 09/11/2013  Prior to Admission medications   Medication Sig Start Date End Date Taking? Authorizing Provider  nebivolol (BYSTOLIC) 5 MG tablet Take 1 tablet (5 mg total) by mouth daily. 12/01/17  Yes Orlena Sheldon, PA-C  SUMAtriptan (IMITREX) 100 MG tablet Take 1 tablet (100 mg total) by mouth once as needed for migraine. May repeat in 2 hours if headache persists or recurs. 12/01/17 12/02/18 Yes Dehnert, Lonie Peak, PA-C  topiramate (TOPAMAX) 50 MG tablet Take 1 tablet (50 mg total) by mouth 2 (two) times daily. Please d/c 25mg  Patient taking differently: Take 50 mg by mouth 2 (two) times daily.   12/01/17  Yes Dena Billet B, PA-C  naproxen sodium (ANAPROX) 220 MG tablet Take 440 mg by mouth 2 (two) times daily as needed (pain).    [provider]  Omega-3 Fatty Acids (FISH OIL) 1000 MG CAPS Take 1,000 mg by mouth daily.    [provider]     No Known Allergies   Family History  Problem Relation Age of Onset  . Breast cancer Mother 25  . Diabetes Father   . Colon cancer Father      Social History   Socioeconomic History  . Marital status: Married    Spouse name: Not on file  . Number of children: Not on file  . Years of education: Not on file  . Highest education level: Not on file  Occupational History  . 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 Financial resource strain: Not on file  . Food insecurity:    Worry: Not on file    Inability: Not on file  . Transportation needs:    Medical: Not on file    Non-medical: Not on file  Tobacco Use  . Smoking status: Never Smoker  . Smokeless tobacco: Never Used  Substance and Sexual Activity  . Alcohol use: No  . Drug use: No  . Sexual activity: Not on file  Lifestyle  . Physical activity:    Days per week: Not on file    Minutes per session: Not on file  . Stress: Not on file  Relationships  . Social connections:    Talks on phone: Not on file    Gets together: Not on file    Attends religious service: Not on file    Active member of club or organization: Not on file    Attends meetings of clubs or organizations: Not on file    Relationship status: Not on file  . Intimate partner violence:    Fear of current or ex partner: Not on file    Emotionally abused: Not on file    Physically abused: Not on file    Forced sexual activity: Not on file  Other Topics Concern  . Not on file  Social History Narrative  . Not on file     Review of Systems  Constitutional: Negative.  Negative for activity change, appetite change, chills,  diaphoresis, fatigue, fever and unexpected weight change.  HENT: Negative.   Eyes: Negative.   Respiratory: Negative for cough, choking, chest tightness, shortness of breath and wheezing.   Cardiovascular: Negative.  Negative for chest pain, palpitations and leg swelling.  Gastrointestinal: Negative.  Negative for abdominal distention, abdominal pain, constipation and nausea.  Endocrine: Negative.  Negative for cold intolerance and heat intolerance.  Genitourinary: Negative.   Musculoskeletal: Negative.  Negative for arthralgias and joint swelling.  Skin:  Negative.   Allergic/Immunologic: Negative.   Neurological: Negative.  Negative for dizziness, weakness, light-headedness and numbness.  Hematological: Negative.   Psychiatric/Behavioral: Negative.        Objective:    Vitals:   07/06/18 1622  BP: 122/70  Pulse: (!) 46  Resp: 16  Temp: 97.7 F (36.5 C)  SpO2: 100%  Weight: 184 lb 6 oz (83.6 kg)  Height: 5\' 2"  (1.575 m)      Physical Exam Constitutional:      General: She is not in acute distress.    Appearance: Normal appearance. She is well-developed. She is not toxic-appearing or diaphoretic.  HENT:     Head: Normocephalic and atraumatic. Hair is normal.     Jaw: There is normal jaw occlusion.     Right Ear: External ear normal.     Left Ear: External ear normal.     Nose: Nose normal.     Mouth/Throat:     Pharynx: Uvula midline.  Eyes:     General: Lids are normal.     Conjunctiva/sclera: Conjunctivae normal.     Pupils: Pupils are equal, round, and reactive to light.  Neck:     Musculoskeletal: Full passive range of motion without pain, normal range of motion and neck supple.     Thyroid: No thyroid mass, thyromegaly or thyroid tenderness.     Trachea: Trachea and phonation normal. No tracheal deviation.  Cardiovascular:     Rate and Rhythm: Regular rhythm. Bradycardia present.     Pulses: Normal pulses.          Radial pulses are 2+ on the right side and  2+ on the left side.       Posterior tibial pulses are 2+ on the right side and 2+ on the left side.     Heart sounds: Normal heart sounds. No murmur. No friction rub. No gallop.   Pulmonary:     Effort: Pulmonary effort is normal. No respiratory distress.     Breath sounds: Normal breath sounds. No stridor. No wheezing, rhonchi or rales.  Chest:     Chest wall: No tenderness.  Abdominal:     General: Bowel sounds are normal. There is no distension.     Palpations: Abdomen is soft.     Tenderness: There is no abdominal tenderness. There is no guarding or rebound.  Musculoskeletal: Normal range of motion.        General: No deformity.     Right lower leg: No edema.     Left lower leg: No edema.  Lymphadenopathy:     Cervical: No cervical adenopathy.  Skin:    General: Skin is warm and dry.     Capillary Refill: Capillary refill takes less than 2 seconds.     Coloration: Skin is not pale.     Findings: No rash.     Comments: Right great toenail avulsed with nail bed with thickened yellow bumps, only about 3-4 mm of new nail growth at proximal nail margine, thin top layer of nail, loose from thicker yellow dry appearing nail near nailbed.  No tenderness, all other toenails appear normal length and thickness.   Fingernails, slightly thin but grossly normal appearing, one fingernail with some dry skin and cuticle and nail slightly cracked  Neurological:     Mental Status: She is alert and oriented to person, place, and time.     Motor: No abnormal muscle tone.     Gait: Gait normal.  Psychiatric:  Speech: Speech normal.        Behavior: Behavior normal.           Assessment & Plan:      ICD-10-CM   1. Essential hypertension Y07 COMPLETE METABOLIC PANEL WITH GFR  2. Migraine without aura and without status migrainosus, not intractable G43.009 topiramate (TOPAMAX) 50 MG tablet  3. Thinning hair L65.9 CBC with Differential/Platelet    COMPLETE METABOLIC PANEL WITH GFR     TSH    T3, free    T4, free   med SE?  r/o thyroid dysfunction, possible nutritional deficiency?  instructed pt to add womens multivitamin to current supplements  4. Change in nail appearance L60.8 TSH    Efinaconazole 10 % SOLN   I suspect fungal infection in left great toenail - topical tx with Efinaconazole 10%, other nails appear fairly normal       Delsa Grana, PA-C 07/06/18 4:26 PM

## 2018-07-07 LAB — COMPLETE METABOLIC PANEL WITH GFR
AG Ratio: 1.5 (calc) (ref 1.0–2.5)
ALT: 11 U/L (ref 6–29)
AST: 15 U/L (ref 10–35)
Albumin: 4.3 g/dL (ref 3.6–5.1)
Alkaline phosphatase (APISO): 66 U/L (ref 37–153)
BILIRUBIN TOTAL: 0.4 mg/dL (ref 0.2–1.2)
BUN: 13 mg/dL (ref 7–25)
CO2: 26 mmol/L (ref 20–32)
CREATININE: 0.86 mg/dL (ref 0.50–1.05)
Calcium: 9.6 mg/dL (ref 8.6–10.4)
Chloride: 104 mmol/L (ref 98–110)
GFR, EST AFRICAN AMERICAN: 90 mL/min/{1.73_m2} (ref >=60)
GFR, EST NON AFRICAN AMERICAN: 78 mL/min/{1.73_m2} (ref >=60)
GLUCOSE: 88 mg/dL (ref 65–99)
Globulin: 2.8 g/dL (calc) (ref 1.9–3.7)
Potassium: 3.9 mmol/L (ref 3.5–5.3)
SODIUM: 138 mmol/L (ref 135–146)
TOTAL PROTEIN: 7.1 g/dL (ref 6.1–8.1)

## 2018-07-07 LAB — CBC WITH DIFFERENTIAL/PLATELET
Absolute Monocytes: 570 cells/uL (ref 200–950)
BASOS PCT: 0.6 %
Basophils Absolute: 40 cells/uL (ref 0–200)
EOS ABS: 67 {cells}/uL (ref 15–500)
EOS PCT: 1 %
HCT: 42.3 % (ref 35.0–45.0)
Hemoglobin: 14.4 g/dL (ref 11.7–15.5)
Lymphs Abs: 2606 cells/uL (ref 850–3900)
MCH: 30.4 pg (ref 27.0–33.0)
MCHC: 34 g/dL (ref 32.0–36.0)
MCV: 89.4 fL (ref 80.0–100.0)
MONOS PCT: 8.5 %
MPV: 11.5 fL (ref 7.5–12.5)
NEUTROS ABS: 3417 {cells}/uL (ref 1500–7800)
Neutrophils Relative %: 51 %
Platelets: 236 10*3/uL (ref 140–400)
RBC: 4.73 10*6/uL (ref 3.80–5.10)
RDW: 12.5 % (ref 11.0–15.0)
Total Lymphocyte: 38.9 %
WBC: 6.7 10*3/uL (ref 3.8–10.8)

## 2018-07-07 LAB — TSH: TSH: 0.86 m[IU]/L

## 2018-07-07 LAB — T4, FREE: Free T4: 1.1 ng/dL (ref 0.8–1.8)

## 2018-07-07 LAB — T3, FREE: T3, Free: 2.9 pg/mL (ref 2.3–4.2)

## 2018-07-08 NOTE — Progress Notes (Signed)
There are many affordable OTC nail fungal topical solutions that you can paint on the nail - have her go to any pharmacy to look for one - almost all of them take 6-12 months - oral medicines that are very hard on their liver also take months of treatment to improve nail fungus.  I'm happy to refer her to podiatry if she would like.    I do not know if the toenail issue and hair loss are related.  Her toenail is the only one that looks like it had thickened areas and some yellowing, the rest of fingernails and toenails look the same.   My suggestion is to switch topimax to another daily migraine preventative medicine - not to just stop all together.    There are several first line medicines that do the same thing as topimax.  I would suggest she switch.  WE can refer her to a HA specialist or neurologist - they are experts in these meds and SE.    Place referrals per her preference.  Thank you Larene Beach!

## 2018-07-20 NOTE — Addendum Note (Signed)
Addended by: Delsa Grana on: 07/20/2018 06:20 PM   Modules accepted: Orders

## 2018-07-20 NOTE — Progress Notes (Signed)
The patient is already taking a medicine called Bystolic which slows down her heart rate, this limits her use of multiple other agents which are similar which slow down the heart and dilated blood vessels and prevent migraine headaches.     We could start Elavil for migraines -usually would start a low dose like 10 mg, take at night because it can make people sleepy, gradually increase the dose.    We would need to have her slowly taper off Topamax by decreasing the dose per day by 25 mg for a week before decreasing it again 25 mg.   For example I would have her continue Topamax 50 mg in the morning and start to decrease Topamax at night to 25 mg and also start taking Elavil 10 mg at night for a week and see how she does.  The Elavil dose can slowly be increased to 20 or 30 mg  - very gradual increase, Max is 150 mg /d  And in about 4 weeks would hope to have her off topimax.    If she has difficulty with the cross taper of medications or her headaches become more severe I strongly encourage her allowing me to refer her to a neurologist or headache specialist, because they will have other medication and treatment options that I do not have.     If she agrees to the cross taper - shannon please Rx elavil 10 mg tablets, take 10-30 mg PO QHS, dispense # 90 with no refills, she would need a 2 week f/up appt to adjust her refill med doses and check on her

## 2018-07-21 ENCOUNTER — Other Ambulatory Visit: Payer: Self-pay | Admitting: Family Medicine

## 2018-07-21 MED ORDER — AMITRIPTYLINE HCL 10 MG PO TABS: ORAL_TABLET | ORAL | 0 refills | Status: DC

## 2018-07-29 ENCOUNTER — Other Ambulatory Visit: Payer: Self-pay | Admitting: Family Medicine

## 2018-07-29 DIAGNOSIS — G43009 Migraine without aura, not intractable, without status migrainosus: Secondary | ICD-10-CM

## 2018-08-14 ENCOUNTER — Other Ambulatory Visit: Payer: Self-pay | Admitting: Family Medicine

## 2018-08-22 ENCOUNTER — Other Ambulatory Visit: Payer: Self-pay | Admitting: Family Medicine

## 2018-08-22 DIAGNOSIS — G43009 Migraine without aura, not intractable, without status migrainosus: Secondary | ICD-10-CM

## 2018-09-23 ENCOUNTER — Other Ambulatory Visit: Payer: Self-pay | Admitting: Family Medicine

## 2018-09-29 DIAGNOSIS — M25561 Pain in right knee: Secondary | ICD-10-CM | POA: Insufficient documentation

## 2018-11-16 ENCOUNTER — Other Ambulatory Visit: Payer: Self-pay

## 2018-11-16 DIAGNOSIS — I10 Essential (primary) hypertension: Secondary | ICD-10-CM

## 2018-11-16 MED ORDER — NEBIVOLOL HCL 5 MG PO TABS: 5.0000 mg | ORAL_TABLET | Freq: Every day | ORAL | 2 refills | Status: DC

## 2019-04-19 LAB — HM PAP SMEAR: HM Pap smear: NEGATIVE

## 2019-05-25 ENCOUNTER — Other Ambulatory Visit: Payer: Self-pay | Admitting: Obstetrics and Gynecology

## 2019-05-25 DIAGNOSIS — Z9189 Other specified personal risk factors, not elsewhere classified: Secondary | ICD-10-CM

## 2019-06-13 ENCOUNTER — Ambulatory Visit: Payer: BC Managed Care – PPO | Admitting: Nurse Practitioner

## 2019-06-13 ENCOUNTER — Other Ambulatory Visit: Payer: Self-pay

## 2019-06-13 ENCOUNTER — Encounter: Payer: Self-pay | Admitting: Nurse Practitioner

## 2019-06-13 VITALS — BP 134/72 | HR 66 | Temp 98.7°F | Resp 16 | Ht 62.0 in | Wt 193.0 lb

## 2019-06-13 DIAGNOSIS — G43009 Migraine without aura, not intractable, without status migrainosus: Secondary | ICD-10-CM

## 2019-06-13 DIAGNOSIS — K5909 Other constipation: Secondary | ICD-10-CM

## 2019-06-13 DIAGNOSIS — I10 Essential (primary) hypertension: Secondary | ICD-10-CM

## 2019-06-13 DIAGNOSIS — L659 Nonscarring hair loss, unspecified: Secondary | ICD-10-CM

## 2019-06-13 DIAGNOSIS — Z13220 Encounter for screening for lipoid disorders: Secondary | ICD-10-CM

## 2019-06-13 NOTE — Progress Notes (Signed)
Established Patient Office Visit  Subjective:  Patient ID: Kristi Rush, female    DOB: 09-Nov-1965  Age: 54 y.o. MRN: [id number]  CC: follow up for concerns of hair thinning, brittle fingernails, HTN monitoring  HPI Kristi Rush is a 53 y.o Caucasian female presenting for a HTN follow up from her last visit 07/06/2018. Pt reports often/occasional constipation and weight gain since last years visit. She admits to decrease in activity since leg injury months ago and COVID precautions. She continues to feel concerned with hair thinning and brittle nails and reported being encouraged by her hairstylist to discuss with PCP. Time spent discussing episodes of increased stress, L/E injury, increased H/A and intiating Bystolic when sxs first appeared. She also reports that her GYN has discussed premenopausal and offered hormone replacement that could help decrease the sxs.  Pt reports Migraines are stable with Bystolic. She reported she has occasional constipation.H/A stable not having to take prn Migraine medication-controlled with Bystolic. Denied CP/CT, palpitations, GU/GI sxs other than stated. Pain, falls, or sob.    Past Medical History:  Diagnosis Date  . Headache   . Hypertension     Past Surgical History:  Procedure Laterality Date  . COLONOSCOPY N/A 02/23/2018   Procedure: COLONOSCOPY;  Surgeon: Rogene Houston, MD;  Location: [facility];  Service: Endoscopy;  Laterality: N/A;  730  . WISDOM TOOTH EXTRACTION     many year ago    Family History  Problem Relation Age of Onset  . Breast cancer Mother 52  . Diabetes Father   . Colon cancer Father     Social History   Socioeconomic History  . Marital status: Married    Spouse name: Not on file  . Number of children: Not on file  . Years of education: Not on file  . Highest education level: Not on file  Occupational History  . 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  Smoking status: Never Smoker  . Smokeless tobacco: Never Used  Substance and Sexual Activity  . Alcohol use: No  . Drug use: No  . Sexual activity: Not on file  Other Topics Concern  . Not on file  Social History Narrative  . Not on file   Social Determinants of Health   Financial Resource Strain:   . Difficulty of Paying Living Expenses: Not on file  Food Insecurity:   . Worried About Charity fundraiser in the Last Year: Not on file  . Ran Out of Food in the Last Year: Not on file  Transportation Needs:   . Lack of Transportation (Medical): Not on file  . Lack of Transportation (Non-Medical): Not on file  Physical Activity:   . Days of Exercise per Week: Not on file  . Minutes of Exercise per Session: Not on file  Stress:   . Feeling of Stress : Not on file  Social Connections:   . Frequency of Communication with Friends and Family: Not on file  . Frequency of Social Gatherings with Friends and Family: Not on file  . Attends Religious Services: Not on file  . Active Member of Clubs or Organizations: Not on file  . Attends Archivist Meetings: Not on file  . Marital Status: Not on file  Intimate Partner Violence:   . Fear of Current or Ex-Partner: Not on file  . Emotionally Abused: Not on file  . Physically Abused:  Not on file  . Sexually Abused: Not on file    Outpatient Medications Prior to Visit  Medication Sig Dispense Refill  . alendronate (FOSAMAX) 70 MG tablet TAKE 1 TABLET(S) EVERY WEEK BY ORAL ROUTE IN THE MORNING FOR 30 DAYS.    Marland Kitchen amitriptyline (ELAVIL) 10 MG tablet TAKE 1 TO 3 TABLETS BY MOUTH AT BEDTIME 90 tablet 0  . naproxen sodium (ANAPROX) 220 MG tablet Take 440 mg by mouth 2 (two) times daily as needed (pain).    . nebivolol (BYSTOLIC) 5 MG tablet Take 1 tablet (5 mg total) by mouth daily. 90 tablet 2  . Omega-3 Fatty Acids (FISH OIL) 1000 MG CAPS Take 1,000 mg by mouth daily.    Marland Kitchen topiramate (TOPAMAX) 50 MG tablet TAKE 1 TABLET BY MOUTH  TWICE A DAY 60 tablet 1  . SUMAtriptan (IMITREX) 100 MG tablet Take 1 tablet (100 mg total) by mouth once as needed for migraine. May repeat in 2 hours if headache persists or recurs. 30 tablet 2   No facility-administered medications prior to visit.    No Known Allergies  ROS Review of Systems  All other systems reviewed and are negative.     Objective:    Physical Exam  Constitutional: She is oriented to person, place, and time. She appears well-developed and well-nourished.  HENT:  Head: Normocephalic.  Eyes: Pupils are equal, round, and reactive to light. Conjunctivae and EOM are normal.  Cardiovascular: Normal rate.  Pulmonary/Chest: Effort normal.  Musculoskeletal:     Cervical back: Normal range of motion and neck supple.  Neurological: She is alert and oriented to person, place, and time.  Skin: Skin is warm and dry.  Psychiatric: She has a normal mood and affect. Her speech is normal and behavior is normal. Judgment and thought content normal. Cognition and memory are normal.  Nursing note and vitals reviewed.   BP 134/72   Pulse 66   Temp 98.7 F (37.1 C) (Temporal)   Resp 16   Ht 5\' 2"  (1.575 m)   Wt 193 lb (87.5 kg)   SpO2 99%   BMI 35.30 kg/m  Wt Readings from Last 3 Encounters:  06/13/19 193 lb (87.5 kg)  07/06/18 184 lb 6 oz (83.6 kg)  02/23/18 180 lb (81.6 kg)     There are no preventive care reminders to display for this patient.  There are no preventive care reminders to display for this patient.  Lab Results  Component Value Date   TSH 0.86 07/06/2018   Lab Results  Component Value Date   WBC 6.7 07/06/2018   HGB 14.4 07/06/2018   HCT 42.3 07/06/2018   MCV 89.4 07/06/2018   PLT 236 07/06/2018   Lab Results  Component Value Date   NA 138 07/06/2018   K 3.9 07/06/2018   CO2 26 07/06/2018   GLUCOSE 88 07/06/2018   BUN 13 07/06/2018   CREATININE 0.86 07/06/2018   BILITOT 0.4 07/06/2018   ALKPHOS 61 08/27/2013   AST 15 07/06/2018    ALT 11 07/06/2018   PROT 7.1 07/06/2018   ALBUMIN 4.1 08/27/2013   CALCIUM 9.6 07/06/2018      Assessment & Plan:   Problem List Items Addressed This Visit      Cardiovascular and Mediastinum   Essential hypertension - Primary   Relevant Orders   CBC with Differential   COMPLETE METABOLIC PANEL WITH GFR   Migraine    Other Visit Diagnoses    Screening cholesterol level  Relevant Orders   Lipid Panel   Alopecia       Relevant Orders   CBC with Differential   T4, free   TSH   Chronic constipation         HTN, Migraine stable. To continue medication as is.   Follow-up:   Follow up end of Feb 2021 for Annual. Come to clinic for labs draw one week prior to appt. For CMP, CBC diff, Thyroid panel, lipid screening.  May take OTC hair and nail vitamins and Multivitamin. F/U with Gyn providers referral to rheumatology. Increase fiber in your diet.  Annie Main, FNP

## 2019-06-13 NOTE — Patient Instructions (Addendum)
Follow up end of Feb 2021 for Annual. Come to clinic for labs draw one week prior to appt.  May take OTC hair and nail vitamins and Multivitamin. F/U with Gyn providers referral to rheumatology. Increase fiber in your diet.

## 2019-06-27 ENCOUNTER — Encounter: Payer: Self-pay | Admitting: Nurse Practitioner

## 2019-06-27 DIAGNOSIS — M810 Age-related osteoporosis without current pathological fracture: Secondary | ICD-10-CM | POA: Insufficient documentation

## 2019-06-27 DIAGNOSIS — R519 Headache, unspecified: Secondary | ICD-10-CM | POA: Insufficient documentation

## 2019-06-27 DIAGNOSIS — Z8739 Personal history of other diseases of the musculoskeletal system and connective tissue: Secondary | ICD-10-CM | POA: Insufficient documentation

## 2019-06-27 DIAGNOSIS — I10 Essential (primary) hypertension: Secondary | ICD-10-CM | POA: Insufficient documentation

## 2019-07-07 ENCOUNTER — Other Ambulatory Visit: Payer: BC Managed Care – PPO

## 2019-07-10 ENCOUNTER — Other Ambulatory Visit: Payer: BC Managed Care – PPO

## 2019-07-10 ENCOUNTER — Other Ambulatory Visit: Payer: Self-pay

## 2019-07-10 DIAGNOSIS — I10 Essential (primary) hypertension: Secondary | ICD-10-CM

## 2019-07-10 DIAGNOSIS — Z1329 Encounter for screening for other suspected endocrine disorder: Secondary | ICD-10-CM

## 2019-07-10 DIAGNOSIS — Z9189 Other specified personal risk factors, not elsewhere classified: Secondary | ICD-10-CM

## 2019-07-10 DIAGNOSIS — Z0000 Encounter for general adult medical examination without abnormal findings: Secondary | ICD-10-CM

## 2019-07-11 ENCOUNTER — Encounter: Payer: BC Managed Care – PPO | Admitting: Nurse Practitioner

## 2019-07-11 LAB — CBC WITH DIFFERENTIAL/PLATELET
Absolute Monocytes: 644 cells/uL (ref 200–950)
Basophils Absolute: 33 cells/uL (ref 0–200)
Basophils Relative: 0.5 %
Eosinophils Absolute: 72 cells/uL (ref 15–500)
Eosinophils Relative: 1.1 %
HCT: 43.1 % (ref 35.0–45.0)
Hemoglobin: 14.7 g/dL (ref 11.7–15.5)
Lymphs Abs: 1892 cells/uL (ref 850–3900)
MCH: 30.9 pg (ref 27.0–33.0)
MCHC: 34.1 g/dL (ref 32.0–36.0)
MCV: 90.7 fL (ref 80.0–100.0)
MPV: 11.7 fL (ref 7.5–12.5)
Monocytes Relative: 9.9 %
Neutro Abs: 3861 cells/uL (ref 1500–7800)
Neutrophils Relative %: 59.4 %
Platelets: 255 10*3/uL (ref 140–400)
RBC: 4.75 10*6/uL (ref 3.80–5.10)
RDW: 12.8 % (ref 11.0–15.0)
Total Lymphocyte: 29.1 %
WBC: 6.5 10*3/uL (ref 3.8–10.8)

## 2019-07-11 LAB — COMPLETE METABOLIC PANEL WITH GFR
AG Ratio: 1.6 (calc) (ref 1.0–2.5)
ALT: 17 U/L (ref 6–29)
AST: 25 U/L (ref 10–35)
Albumin: 4.3 g/dL (ref 3.6–5.1)
Alkaline phosphatase (APISO): 57 U/L (ref 37–153)
BUN: 13 mg/dL (ref 7–25)
CO2: 27 mmol/L (ref 20–32)
Calcium: 9.5 mg/dL (ref 8.6–10.4)
Chloride: 105 mmol/L (ref 98–110)
Creat: 0.66 mg/dL (ref 0.50–1.05)
GFR, Est African American: 117 mL/min/{1.73_m2} (ref >=60)
GFR, Est Non African American: 101 mL/min/{1.73_m2} (ref >=60)
Globulin: 2.7 g/dL (calc) (ref 1.9–3.7)
Glucose, Bld: 86 mg/dL (ref 65–99)
Potassium: 4.4 mmol/L (ref 3.5–5.3)
Sodium: 141 mmol/L (ref 135–146)
Total Bilirubin: 0.6 mg/dL (ref 0.2–1.2)
Total Protein: 7 g/dL (ref 6.1–8.1)

## 2019-07-11 LAB — T4, FREE: Free T4: 1.3 ng/dL (ref 0.8–1.8)

## 2019-07-11 LAB — LIPID PANEL
Cholesterol: 184 mg/dL (ref <200)
HDL: 56 mg/dL (ref >=50)
LDL Cholesterol (Calc): 114 mg/dL (calc) — ABNORMAL HIGH
Non-HDL Cholesterol (Calc): 128 mg/dL (calc) (ref <130)
Total CHOL/HDL Ratio: 3.3 (calc) (ref <5.0)
Triglycerides: 62 mg/dL (ref <150)

## 2019-07-11 LAB — TSH: TSH: 0.81 mIU/L

## 2019-07-13 ENCOUNTER — Other Ambulatory Visit: Payer: Self-pay

## 2019-07-13 ENCOUNTER — Ambulatory Visit (INDEPENDENT_AMBULATORY_CARE_PROVIDER_SITE_OTHER): Payer: BC Managed Care – PPO | Admitting: Nurse Practitioner

## 2019-07-13 VITALS — BP 124/90 | HR 64 | Temp 98.3°F | Resp 18 | Ht 62.0 in | Wt 192.0 lb

## 2019-07-13 DIAGNOSIS — L608 Other nail disorders: Secondary | ICD-10-CM

## 2019-07-13 DIAGNOSIS — I10 Essential (primary) hypertension: Secondary | ICD-10-CM

## 2019-07-13 DIAGNOSIS — L659 Nonscarring hair loss, unspecified: Secondary | ICD-10-CM | POA: Diagnosis not present

## 2019-07-13 DIAGNOSIS — Z0000 Encounter for general adult medical examination without abnormal findings: Secondary | ICD-10-CM

## 2019-07-13 DIAGNOSIS — E7800 Pure hypercholesterolemia, unspecified: Secondary | ICD-10-CM

## 2019-07-13 NOTE — Progress Notes (Signed)
Established Patient Office Visit  Subjective:  Patient ID: Kristi Rush, female    DOB: [birth year]  Age: 54 y.o. MRN: [id number]  CC: here for annual examination  HPI Kristi Rush is a 54 y.o. caucasian female presenting for her annual wellness visit. On her prior visit with me she was concerned with long term hair thinning and brittle thin nails without treament. Thyroid was normal. No cp/ct, gu/gi sxs, pain, sob, edema, or falls over past 12 month.  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 cscope completed 2019 on 5 year track.   Past Medical History:  Diagnosis Date  . H/O osteopenia   . Headache   . Hypertension   . Osteoporosis     Past Surgical History:  Procedure Laterality Date  . COLONOSCOPY N/A 02/23/2018   Procedure: COLONOSCOPY;  Surgeon: Rogene Houston, MD;  Location: [facility];  Service: Endoscopy;  Laterality: N/A;  730  . WISDOM TOOTH EXTRACTION     many year ago    Family History  Problem Relation Age of Onset  . Breast cancer Mother 29  . Diabetes Father   . Colon cancer Father     Social History   Socioeconomic History  . Marital status: Married    Spouse name: Not on file  . Number of children: Not on file  . Years of education: Not on file  . Highest education level: Not on file  Occupational History  . 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 Smoking status: Never Smoker  . Smokeless tobacco: Never Used  Substance and Sexual Activity  . Alcohol use: No  . Drug use: No  . Sexual activity: Not on file  Other Topics Concern  . Not on file  Social History Narrative  . Not on file   Social Determinants of Health   Financial Resource Strain:   . Difficulty of Paying Living Expenses: Not on file  Food Insecurity:   . Worried About Charity fundraiser in the Last Year: Not on file  . Ran Out of  Food in the Last Year: Not on file  Transportation Needs:   . Lack of Transportation (Medical): Not on file  . Lack of Transportation (Non-Medical): Not on file  Physical Activity:   . Days of Exercise per Week: Not on file  . Minutes of Exercise per Session: Not on file  Stress:   . Feeling of Stress : Not on file  Social Connections:   . Frequency of Communication with Friends and Family: Not on file  . Frequency of Social Gatherings with Friends and Family: Not on file  . Attends Religious Services: Not on file  . Active Member of Clubs or Organizations: Not on file  . Attends Archivist Meetings: Not on file  . Marital Status: Not on file  Intimate Partner Violence:   . Fear of Current or Ex-Partner: Not on file  . Emotionally Abused: Not on file  . Physically Abused: Not on file  . Sexually Abused: Not on file    Outpatient Medications Prior to Visit  Medication Sig Dispense Refill  . alendronate (FOSAMAX) 70 MG tablet TAKE 1 TABLET(S) EVERY WEEK BY ORAL ROUTE IN THE MORNING FOR 30 DAYS.    Marland Kitchen nebivolol (BYSTOLIC) 5 MG tablet Take 1 tablet (  5 mg total) by mouth daily. 90 tablet 2  . Omega-3 Fatty Acids (FISH OIL) 1000 MG CAPS Take 1,000 mg by mouth daily.    Marland Kitchen amitriptyline (ELAVIL) 10 MG tablet TAKE 1 TO 3 TABLETS BY MOUTH AT BEDTIME 90 tablet 0  . naproxen sodium (ANAPROX) 220 MG tablet Take 440 mg by mouth 2 (two) times daily as needed (pain).    . SUMAtriptan (IMITREX) 100 MG tablet Take 1 tablet (100 mg total) by mouth once as needed for migraine. May repeat in 2 hours if headache persists or recurs. 30 tablet 2  . topiramate (TOPAMAX) 50 MG tablet TAKE 1 TABLET BY MOUTH TWICE A DAY 60 tablet 1   No facility-administered medications prior to visit.    No Known Allergies  ROS Review of Systems  All other systems reviewed and are negative.     Objective:    Physical Exam  Constitutional: She is oriented to person, place, and time. Vital signs are  normal. She appears well-developed and well-nourished.  HENT:  Head: Normocephalic.  Right Ear: Hearing, tympanic membrane, external ear and ear canal normal.  Left Ear: Hearing, tympanic membrane, external ear and ear canal normal.  Nose: Nose normal.  Mouth/Throat: Uvula is midline, oropharynx is clear and moist and mucous membranes are normal.  Eyes: Pupils are equal, round, and reactive to light. Conjunctivae, EOM and lids are normal. Lids are everted and swept, no foreign bodies found. No scleral icterus.  Neck: Trachea normal. No JVD present. No tracheal deviation present. No thyroid mass and no thyromegaly present.  Cardiovascular: Normal rate, regular rhythm, S1 normal, S2 normal and normal pulses.  Pulmonary/Chest: Effort normal and breath sounds normal.  Abdominal: Soft. Normal appearance, normal aorta and bowel sounds are normal. There is no hepatosplenomegaly. There is no abdominal tenderness. There is no CVA tenderness.  Genitourinary:    Genitourinary Comments: Deferred by pt for GYN due    Musculoskeletal:        General: No tenderness, deformity or edema. Normal range of motion.     Cervical back: Normal range of motion and neck supple.  Lymphadenopathy:       Head (right side): No submental, no submandibular, no tonsillar, no preauricular, no posterior auricular and no occipital adenopathy present.       Head (left side): No submental, no submandibular, no tonsillar, no preauricular, no posterior auricular and no occipital adenopathy present.    She has no cervical adenopathy.  Neurological: She is alert and oriented to person, place, and time. She has normal strength. No cranial nerve deficit. Coordination normal.  Skin: Skin is warm, dry and intact.  Psychiatric: She has a normal mood and affect. Her speech is normal and behavior is normal. Judgment and thought content normal. Cognition and memory are normal.  Nursing note and vitals reviewed.   BP 124/90 (BP Location:  Left Arm, Patient Position: Sitting, Cuff Size: Normal)   Pulse 64   Temp 98.3 F (36.8 C) (Oral)   Resp 18   Ht 5\' 2"  (1.575 m)   Wt 192 lb (87.1 kg)   SpO2 97%   BMI 35.12 kg/m  Wt Readings from Last 3 Encounters:  07/13/19 192 lb (87.1 kg)  06/13/19 193 lb (87.5 kg)  07/06/18 184 lb 6 oz (83.6 kg)    Lab Results  Component Value Date   TSH 0.81 07/10/2019   Lab Results  Component Value Date   WBC 6.5 07/10/2019   HGB 14.7 07/10/2019  HCT 43.1 07/10/2019   MCV 90.7 07/10/2019   PLT 255 07/10/2019   Lab Results  Component Value Date   NA 141 07/10/2019   K 4.4 07/10/2019   CO2 27 07/10/2019   GLUCOSE 86 07/10/2019   BUN 13 07/10/2019   CREATININE 0.66 07/10/2019   BILITOT 0.6 07/10/2019   ALKPHOS 61 08/27/2013   AST 25 07/10/2019   ALT 17 07/10/2019   PROT 7.0 07/10/2019   ALBUMIN 4.1 08/27/2013   CALCIUM 9.5 07/10/2019   Lab Results  Component Value Date   CHOL 184 07/10/2019   Lab Results  Component Value Date   HDL 56 07/10/2019   Lab Results  Component Value Date   LDLCALC 114 (H) 07/10/2019   Lab Results  Component Value Date   TRIG 62 07/10/2019   Lab Results  Component Value Date   CHOLHDL 3.3 07/10/2019     Assessment & Plan:  Thinning hair and nails: Take daily multivitamin and Hari and nail supplement such as Natures Bounty Hair, Skin, and Nails gummies. Thyroid levels are normal.  Follow up appointment for cholesterol labs in 6 months.  Annual wellness yearly with labs one week prior to appointment.  Have fasting BG at next visit to follow trending upper normal limit with family h/o DM  Problem List Items Addressed This Visit      Cardiovascular and Mediastinum   Essential hypertension    Other Visit Diagnoses    Annual physical exam    -  Primary   Thinning hair       Change in nail appearance       Elevated LDL cholesterol level       Relevant Orders   Lipid panel      No orders of the defined types were placed in  this encounter.   Follow-up: Return in about 6 months (around 01/10/2020), or have labs drawn one week prior to appointment. Come to the office fasting the day of your appointment to have fasting blood sugar finger stick related to trending upper normal BG and family h/o DM.    Annie Main, FNP

## 2019-07-14 ENCOUNTER — Other Ambulatory Visit: Payer: Self-pay | Admitting: Family Medicine

## 2019-07-14 DIAGNOSIS — I10 Essential (primary) hypertension: Secondary | ICD-10-CM

## 2019-09-08 ENCOUNTER — Other Ambulatory Visit: Payer: Self-pay | Admitting: Family Medicine

## 2019-09-08 DIAGNOSIS — I10 Essential (primary) hypertension: Secondary | ICD-10-CM

## 2019-09-21 ENCOUNTER — Telehealth: Payer: Self-pay | Admitting: Nurse Practitioner

## 2019-09-21 DIAGNOSIS — I10 Essential (primary) hypertension: Secondary | ICD-10-CM

## 2019-09-21 NOTE — Telephone Encounter (Signed)
CB #  [phone] Refill nebivolol (BYSTOLIC)

## 2019-09-22 MED ORDER — NEBIVOLOL HCL 5 MG PO TABS: 5.0000 mg | ORAL_TABLET | Freq: Every day | ORAL | 2 refills | Status: DC

## 2020-04-09 ENCOUNTER — Ambulatory Visit (INDEPENDENT_AMBULATORY_CARE_PROVIDER_SITE_OTHER): Payer: BC Managed Care – PPO | Admitting: Family Medicine

## 2020-04-09 ENCOUNTER — Other Ambulatory Visit: Payer: Self-pay

## 2020-04-09 VITALS — BP 128/88 | HR 59 | Temp 97.6°F | Resp 16 | Ht 62.0 in | Wt 193.8 lb

## 2020-04-09 DIAGNOSIS — G43009 Migraine without aura, not intractable, without status migrainosus: Secondary | ICD-10-CM

## 2020-04-09 DIAGNOSIS — M1711 Unilateral primary osteoarthritis, right knee: Secondary | ICD-10-CM

## 2020-04-09 DIAGNOSIS — E669 Obesity, unspecified: Secondary | ICD-10-CM

## 2020-04-09 DIAGNOSIS — M2550 Pain in unspecified joint: Secondary | ICD-10-CM | POA: Diagnosis not present

## 2020-04-09 DIAGNOSIS — I10 Essential (primary) hypertension: Secondary | ICD-10-CM

## 2020-04-09 DIAGNOSIS — E559 Vitamin D deficiency, unspecified: Secondary | ICD-10-CM

## 2020-04-09 NOTE — Progress Notes (Signed)
Subjective:    Patient ID: Kristi Rush, female    DOB: 1966-04-30, 54 y.o.   MRN: [id number]  Patient presents for Hypertension (pt does report headaches but denies other physical symptoms, pt has been tracking BP at home, pt reports no side effects ) and Leg Swelling (pt was treated for swelling in her legs and slow weight gain from leg injury )   Pt here to f/u medications Discharge planning inpatient.  Chart reviewed.  Hypertension states her blood pressure has been going up.  She does have history of underlying migraines and these have worsened in the past few months as well.  Her blood pressure at home will be 130s over 90s.  She is currently on Bystolic 5 mg once a day.  She has had migraines for many years which started hospital.  The past she was on Topamax but she did not like the side effects of medication.  She also had Imitrex on hand.  She thinks that she has been stressed from this at this point surrounding her weight gain.  States she is about 4 pounds in the past couple years as she had a recent issues with her right knee and leg.  She was not previous runner when she had an injury.  Now she has daily constant pain in her right knee she has seen 2 different orthopedics just went through steroid injections and is contemplating having the surgery.  She takes Tylenol ibuprofen as needed for pain.  She also however has other joints that hurt including her shoulder and just seems achy all over.  She does have family history of rheumatoid arthritis in her paternal grandmother.  She has never been worked up for any autoimmune disease.    Review Of Systems:  GEN- denies fatigue, fever, weight loss,weakness, recent illness HEENT- denies eye drainage, change in vision, nasal discharge, CVS- denies chest pain, palpitations RESP- denies SOB, cough, wheeze ABD- denies N/V, change in stools, abd pain GU- denies dysuria, hematuria, dribbling, incontinence MSK- + joint pain, muscle aches,  injury Neuro- denies headache, dizziness, syncope, seizure activity       Objective:    BP 128/88    Pulse (!) 59    Temp 97.6 F (36.4 C) (Temporal)    Resp 16    Ht 5\' 2"  (1.575 m)    Wt 193 lb 12.8 oz (87.9 kg)    SpO2 98%    BMI 35.45 kg/m  GEN- NAD, alert and oriented x3 HEENT- PERRL, EOMI, non injected sclera, pink conjunctiva, MMM, oropharynx clear Neck- Supple, no thyromegaly CVS- RRR, no murmur RESP-CTAB ABD-NABS,soft,NT,ND Psych tearful at times, not anxious, good eye contact  MSK- Right knee with effusion prominent size compared to left  EXT- trace edema RLE  Pulses- Radial, DP- 2+        Assessment & Plan:      Problem List Items Addressed This Visit      Unprioritized   Essential hypertension    Increase Bystolic to 10 mg once a day to help with blood pressure and migraine headache. She is already following lower carb diet as well as low-salt diet.  Heavy exercise was a significant benefit to keep her weight under control and she is unable to do this due to her polyarthralgia and severe osteoarthritis of the right knee which she will most likely require knee replacement at her young age.  I think that she would benefit from weight loss medication.  Seems that she  was on vaccinating something similar in the past and had side effects and now reported with this medication setting of her blood pressure and migraines.  I think she would benefit from GLP-1 therapy.  We will check labs today we will plan to start by her Ozempic for Saxenda based on coverage.  Check vitamin D level she is on Fosamax.      Relevant Orders   CBC with Differential/Platelet (Completed)   Comprehensive metabolic panel (Completed)   Migraine   Obesity (BMI 30-39.9)   Relevant Orders   Hemoglobin A1c (Completed)    Other Visit Diagnoses    Polyarthralgia    -  Primary   Relevant Orders   Rheumatoid factor (Completed)   C-reactive protein (Completed)   Sedimentation Rate (Completed)    ANA   Primary osteoarthritis of right knee       Vitamin D deficiency       Relevant Orders   Vitamin D, 25-hydroxy (Completed)      Note: This dictation was prepared with Dragon dictation along with smaller phrase technology. Any transcriptional errors that result from this process are unintentional.

## 2020-04-09 NOTE — Patient Instructions (Signed)
Increase bystolic to  10mg  once a day  I will plan to start either Ozempic or Saxenda for weight loss We will call with lab results  F/U pending results

## 2020-04-10 ENCOUNTER — Encounter: Payer: Self-pay | Admitting: Family Medicine

## 2020-04-10 LAB — COMPREHENSIVE METABOLIC PANEL
AG Ratio: 1.4 (calc) (ref 1.0–2.5)
ALT: 16 U/L (ref 6–29)
AST: 16 U/L (ref 10–35)
Albumin: 4.2 g/dL (ref 3.6–5.1)
Alkaline phosphatase (APISO): 68 U/L (ref 37–153)
BUN: 16 mg/dL (ref 7–25)
CO2: 28 mmol/L (ref 20–32)
Calcium: 10 mg/dL (ref 8.6–10.4)
Chloride: 103 mmol/L (ref 98–110)
Creat: 0.89 mg/dL (ref 0.50–1.05)
Globulin: 2.9 g/dL (calc) (ref 1.9–3.7)
Glucose, Bld: 102 mg/dL — ABNORMAL HIGH (ref 65–99)
Potassium: 4.6 mmol/L (ref 3.5–5.3)
Sodium: 139 mmol/L (ref 135–146)
Total Bilirubin: 0.4 mg/dL (ref 0.2–1.2)
Total Protein: 7.1 g/dL (ref 6.1–8.1)

## 2020-04-10 LAB — CBC WITH DIFFERENTIAL/PLATELET
Absolute Monocytes: 740 cells/uL (ref 200–950)
Basophils Absolute: 37 cells/uL (ref 0–200)
Basophils Relative: 0.5 %
Eosinophils Absolute: 148 cells/uL (ref 15–500)
Eosinophils Relative: 2 %
HCT: 44.5 % (ref 35.0–45.0)
Hemoglobin: 15.2 g/dL (ref 11.7–15.5)
Lymphs Abs: 2190 cells/uL (ref 850–3900)
MCH: 30.3 pg (ref 27.0–33.0)
MCHC: 34.2 g/dL (ref 32.0–36.0)
MCV: 88.8 fL (ref 80.0–100.0)
MPV: 11.2 fL (ref 7.5–12.5)
Monocytes Relative: 10 %
Neutro Abs: 4285 cells/uL (ref 1500–7800)
Neutrophils Relative %: 57.9 %
Platelets: 257 10*3/uL (ref 140–400)
RBC: 5.01 10*6/uL (ref 3.80–5.10)
RDW: 12.3 % (ref 11.0–15.0)
Total Lymphocyte: 29.6 %
WBC: 7.4 10*3/uL (ref 3.8–10.8)

## 2020-04-10 LAB — HEMOGLOBIN A1C
Hgb A1c MFr Bld: 5.2 % of total Hgb (ref <5.7)
Mean Plasma Glucose: 103 (calc)
eAG (mmol/L): 5.7 (calc)

## 2020-04-10 LAB — RHEUMATOID FACTOR: Rheumatoid fact SerPl-aCnc: <14 IU/mL (ref <14)

## 2020-04-10 LAB — SEDIMENTATION RATE: Sed Rate: 9 mm/h (ref 0–30)

## 2020-04-10 LAB — C-REACTIVE PROTEIN: CRP: 3.8 mg/L (ref <8.0)

## 2020-04-10 LAB — ANA: Anti Nuclear Antibody (ANA): NEGATIVE

## 2020-04-10 LAB — VITAMIN D 25 HYDROXY (VIT D DEFICIENCY, FRACTURES): Vit D, 25-Hydroxy: 40 ng/mL (ref 30–100)

## 2020-04-10 NOTE — Assessment & Plan Note (Signed)
Increase Bystolic to 10 mg once a day to help with blood pressure and migraine headache. She is already following lower carb diet as well as low-salt diet.  Heavy exercise was a significant benefit to keep her weight under control and she is unable to do this due to her polyarthralgia and severe osteoarthritis of the right knee which she will most likely require knee replacement at her young age.  I think that she would benefit from weight loss medication.  Seems that she was on vaccinating something similar in the past and had side effects and now reported with this medication setting of her blood pressure and migraines.  I think she would benefit from GLP-1 therapy.  We will check labs today we will plan to start by her Ozempic for Saxenda based on coverage.  Check vitamin D level she is on Fosamax.

## 2020-04-15 MED ORDER — OZEMPIC (0.25 OR 0.5 MG/DOSE) 2 MG/1.5ML ~~LOC~~ SOPN: 0.2500 mg | PEN_INJECTOR | SUBCUTANEOUS | 1 refills | Status: DC

## 2020-04-15 NOTE — Addendum Note (Signed)
Addended by: Vic Blackbird F on: 04/15/2020 07:49 AM   Modules accepted: Orders

## 2020-05-13 ENCOUNTER — Other Ambulatory Visit: Payer: Self-pay | Admitting: Obstetrics and Gynecology

## 2020-05-13 DIAGNOSIS — Z9189 Other specified personal risk factors, not elsewhere classified: Secondary | ICD-10-CM

## 2020-05-14 ENCOUNTER — Other Ambulatory Visit: Payer: Self-pay | Admitting: *Deleted

## 2020-05-14 DIAGNOSIS — I10 Essential (primary) hypertension: Secondary | ICD-10-CM

## 2020-05-14 MED ORDER — NEBIVOLOL HCL 10 MG PO TABS: 10.0000 mg | ORAL_TABLET | Freq: Every day | ORAL | 1 refills | Status: DC

## 2020-05-24 ENCOUNTER — Other Ambulatory Visit: Payer: Self-pay | Admitting: *Deleted

## 2020-05-24 MED ORDER — OZEMPIC (0.25 OR 0.5 MG/DOSE) 2 MG/1.5ML ~~LOC~~ SOPN: 0.2500 mg | PEN_INJECTOR | SUBCUTANEOUS | 1 refills | Status: DC

## 2020-06-04 ENCOUNTER — Telehealth (INDEPENDENT_AMBULATORY_CARE_PROVIDER_SITE_OTHER): Payer: Self-pay | Admitting: Family Medicine

## 2020-06-04 ENCOUNTER — Other Ambulatory Visit: Payer: Self-pay

## 2020-06-04 ENCOUNTER — Encounter: Payer: Self-pay | Admitting: Family Medicine

## 2020-06-04 DIAGNOSIS — I10 Essential (primary) hypertension: Secondary | ICD-10-CM

## 2020-06-04 DIAGNOSIS — E669 Obesity, unspecified: Secondary | ICD-10-CM

## 2020-06-04 MED ORDER — OZEMPIC (0.25 OR 0.5 MG/DOSE) 2 MG/1.5ML ~~LOC~~ SOPN: 0.5000 mg | PEN_INJECTOR | SUBCUTANEOUS | 1 refills | Status: DC

## 2020-06-04 NOTE — Progress Notes (Signed)
Virtual Visit via Telephone Note  I connected with Kristi Rush on 06/04/20 at 10:45am  by telephone and verified that I am speaking with the correct person using two identifiers.  Location: Patient: at home Provider:at home office   I discussed the limitations, risks, security and privacy concerns of performing an evaluation and management service by telephone and the availability of in person appointments. I also discussed with the patient that there may be a patient responsible charge related to this service. The patient expressed understanding and agreed to proceed.   History of Present Illness: Kristi Rush- she has been trying to follow a lower carb diet. She initiallyhad some bruising with the ozempic injection but that has resolved. Also takes with a snack in the evening and that helped the HA/nausea she initially had. She does notice it has curved her appetite some. Her clothe feel a little looser but she has not weighed herself. THe biggest improvement is less pain on her knees    HTN- she has been on increased dose of bystolic without and SE, has not checked bp at home but feels good on the dose    Observations/Objective: Unable to observe , NAD noted over phone   Assessment and Plan: Obesity - Increase ozempic in 0.5mg  once a week, continue lower carb diet, eating veggies and protein with lunch and dinner, water intake discussed  OA knees improved with weight loss  HTN tolerating bystolic no recent BP numbers, will check in OV in a few weeks  Follow Up Instructions: approx 4 weeks in office     I discussed the assessment and treatment plan with the patient. The patient was provided an opportunity to ask questions and all were answered. The patient agreed with the plan and demonstrated an understanding of the instructions.   The patient was advised to call back or seek an in-person evaluation if the symptoms worsen or if the condition fails to improve as anticipated.  I  provided 13 minutes of non-face-to-face time during this encounter. End Time 10:58  Vic Blackbird, MD

## 2020-06-08 ENCOUNTER — Other Ambulatory Visit: Payer: BC Managed Care – PPO

## 2020-06-26 ENCOUNTER — Other Ambulatory Visit: Payer: Self-pay

## 2020-06-26 ENCOUNTER — Ambulatory Visit (INDEPENDENT_AMBULATORY_CARE_PROVIDER_SITE_OTHER): Payer: BC Managed Care – PPO | Admitting: Family Medicine

## 2020-06-26 ENCOUNTER — Encounter: Payer: Self-pay | Admitting: Family Medicine

## 2020-06-26 VITALS — BP 134/82 | HR 64 | Temp 98.2°F | Resp 14 | Ht 62.0 in | Wt 191.0 lb

## 2020-06-26 DIAGNOSIS — I10 Essential (primary) hypertension: Secondary | ICD-10-CM | POA: Diagnosis not present

## 2020-06-26 DIAGNOSIS — E669 Obesity, unspecified: Secondary | ICD-10-CM

## 2020-06-26 MED ORDER — OZEMPIC (0.25 OR 0.5 MG/DOSE) 2 MG/1.5ML ~~LOC~~ SOPN
0.5000 mg | PEN_INJECTOR | SUBCUTANEOUS | 1 refills | Status: DC
Start: 2020-06-26 — End: 2020-08-09

## 2020-06-26 MED ORDER — NEBIVOLOL HCL 10 MG PO TABS: 10.0000 mg | ORAL_TABLET | Freq: Every day | ORAL | 1 refills | Status: DC

## 2020-06-26 NOTE — Assessment & Plan Note (Signed)
Continue with ozempic 0.5mg  weekly Low carb, high protein diet  Discussed soda alternatives  F/u 2 months with new PCP

## 2020-06-26 NOTE — Assessment & Plan Note (Signed)
Controlled no changes 

## 2020-06-26 NOTE — Patient Instructions (Signed)
F/U 2 months Dr. Dennard Schaumann   Keep ozempic at  0.5mg  once a week

## 2020-06-26 NOTE — Progress Notes (Signed)
   Subjective:    Patient ID: Kristi Rush, female    DOB: 09/14/1965, 55 y.o.   MRN: [id number]  Patient presents for Medication  Review (Ozempic)  Here for interim follow-up on weight loss.  She is at 1 dose of Ozempic 0.5 mg after we had a telehealth visit a few weeks ago.  She does notice that the medication does curb her appetite.  Initially she had some GI upset she had constipation followed by diarrhea but that has now resolved itself.  She eats high-protein low-carb.  Has mostly veggies with her meals.  She typically eats 2 full meals a day and a snack.  She has noticed however that she was craving soda recently even though will be small amounts.  She is looking for alternatives to the soda.  She is getting tired of drinking water at times.  She still unable to exercise because of her chronic right leg and knee pain but this has improved since she has lost some weight.   Review Of Systems:  GEN- denies fatigue, fever, weight loss,weakness, recent illness HEENT- denies eye drainage, change in vision, nasal discharge, CVS- denies chest pain, palpitations RESP- denies SOB, cough, wheeze ABD- denies N/V, change in stools, abd pain GU- denies dysuria, hematuria, dribbling, incontinence MSK- denies joint pain, muscle aches, injury Neuro- denies headache, dizziness, syncope, seizure activity       Objective:    BP 134/82   Pulse 64   Temp 98.2 F (36.8 C) (Temporal)   Resp 14   Ht 5\' 2"  (1.575 m)   Wt 191 lb (86.6 kg)   SpO2 98%   BMI 34.93 kg/m  GEN- NAD, alert and oriented x3 HEENT- PERRL, EOMI, non injected sclera, pink conjunctiva, Neck- Supple, no thyromegaly CVS- RRR, no murmur RESP-CTAB ABD-NABS,soft,NT,ND EXT- No edema Pulses- Radial 2+        Assessment & Plan:      Problem List Items Addressed This Visit      Unprioritized   Essential hypertension - Primary    Controlled no changes       Relevant Medications   nebivolol (BYSTOLIC) 10 MG tablet    Obesity (BMI 30-39.9)    Continue with ozempic 0.5mg  weekly Low carb, high protein diet  Discussed soda alternatives  F/u 2 months with new PCP       Relevant Medications   Semaglutide,0.25 or 0.5MG /DOS, (OZEMPIC, 0.25 OR 0.5 MG/DOSE,) 2 MG/1.5ML SOPN      Note: This dictation was prepared with Dragon dictation along with smaller phrase technology. Any transcriptional errors that result from this process are unintentional.

## 2020-08-06 ENCOUNTER — Other Ambulatory Visit: Payer: Self-pay

## 2020-08-06 ENCOUNTER — Ambulatory Visit: Payer: BC Managed Care – PPO | Admitting: Dermatology

## 2020-08-06 ENCOUNTER — Encounter: Payer: Self-pay | Admitting: Dermatology

## 2020-08-06 DIAGNOSIS — B079 Viral wart, unspecified: Secondary | ICD-10-CM | POA: Diagnosis not present

## 2020-08-06 DIAGNOSIS — Z1283 Encounter for screening for malignant neoplasm of skin: Secondary | ICD-10-CM | POA: Diagnosis not present

## 2020-08-06 DIAGNOSIS — Z86007 Personal history of in-situ neoplasm of skin: Secondary | ICD-10-CM | POA: Diagnosis not present

## 2020-08-09 ENCOUNTER — Other Ambulatory Visit: Payer: Self-pay | Admitting: *Deleted

## 2020-08-09 DIAGNOSIS — I10 Essential (primary) hypertension: Secondary | ICD-10-CM

## 2020-08-09 MED ORDER — NEBIVOLOL HCL 10 MG PO TABS: 10.0000 mg | ORAL_TABLET | Freq: Every day | ORAL | 1 refills | Status: DC

## 2020-08-09 MED ORDER — OZEMPIC (0.25 OR 0.5 MG/DOSE) 2 MG/1.5ML ~~LOC~~ SOPN: 0.5000 mg | PEN_INJECTOR | SUBCUTANEOUS | 1 refills | Status: DC

## 2020-08-18 ENCOUNTER — Encounter: Payer: Self-pay | Admitting: Dermatology

## 2020-08-18 NOTE — Progress Notes (Signed)
   New Patient   Subjective  Kristi Rush is a 55 y.o. female who presents for the following: Skin Problem (Mole on back raised, warts right thumb & left leg).  General skin check Location:  Duration:  Quality:  Associated Signs/Symptoms: Modifying Factors:  Severity:  Timing: Context:    The following portions of the chart were reviewed this encounter and updated as appropriate:  Tobacco  Allergies  Meds  Problems  Med Hx  Surg Hx  Fam Hx      Objective  Well appearing patient in no apparent distress; mood and affect are within normal limits. Objective  Waist Up: Waist up skin check plus legs. No atypical moles, no melanoma and no non mole skin cancer.  Objective  Left Lower Leg - Anterior, Right Proximal Thumb: 3 mm verrucous papules.  Discussed possibility of shaving leg causing spread of virus.  Objective  Left Mid Back: Scar clear    A full examination was performed including scalp, head, eyes, ears, nose, lips, neck, chest, axillae, abdomen, back, buttocks, bilateral upper extremities, bilateral lower extremities, hands, feet, fingers, toes, fingernails, and toenails. All findings within normal limits unless otherwise noted below.  Areas beneath undergarments not fully examined.   Assessment & Plan  Screening exam for skin cancer Waist Up  Yearly skin check, encouraged to self examine twice annually.  Continued ultraviolet protection.  Viral warts, unspecified type (2) Left Lower Leg - Anterior; Right Proximal Thumb  Patient told there is no perfect therapy since there is no antiviral currently available.  If single freeze does not eliminate the spot she may try the home freeze.  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

## 2020-09-02 ENCOUNTER — Encounter: Payer: Self-pay | Admitting: Family Medicine

## 2020-09-02 ENCOUNTER — Other Ambulatory Visit: Payer: Self-pay

## 2020-09-02 ENCOUNTER — Ambulatory Visit: Payer: Self-pay | Admitting: Family Medicine

## 2020-09-02 ENCOUNTER — Ambulatory Visit: Payer: BC Managed Care – PPO | Admitting: Family Medicine

## 2020-09-02 VITALS — BP 132/74 | HR 70 | Temp 98.2°F | Resp 14 | Ht 62.0 in | Wt 187.0 lb

## 2020-09-02 DIAGNOSIS — I10 Essential (primary) hypertension: Secondary | ICD-10-CM | POA: Diagnosis not present

## 2020-09-02 DIAGNOSIS — E669 Obesity, unspecified: Secondary | ICD-10-CM | POA: Diagnosis not present

## 2020-09-02 LAB — COMPLETE METABOLIC PANEL WITH GFR
AG Ratio: 1.6 (calc) (ref 1.0–2.5)
ALT: 12 U/L (ref 6–29)
AST: 14 U/L (ref 10–35)
Albumin: 4.1 g/dL (ref 3.6–5.1)
Alkaline phosphatase (APISO): 58 U/L (ref 37–153)
BUN: 10 mg/dL (ref 7–25)
CO2: 29 mmol/L (ref 20–32)
Calcium: 9.5 mg/dL (ref 8.6–10.4)
Chloride: 105 mmol/L (ref 98–110)
Creat: 0.81 mg/dL (ref 0.50–1.05)
GFR, Est African American: 95 mL/min/{1.73_m2} (ref >=60)
GFR, Est Non African American: 82 mL/min/{1.73_m2} (ref >=60)
Globulin: 2.6 g/dL (calc) (ref 1.9–3.7)
Glucose, Bld: 88 mg/dL (ref 65–99)
Potassium: 4.7 mmol/L (ref 3.5–5.3)
Sodium: 142 mmol/L (ref 135–146)
Total Bilirubin: 0.4 mg/dL (ref 0.2–1.2)
Total Protein: 6.7 g/dL (ref 6.1–8.1)

## 2020-09-02 LAB — LIPID PANEL
Cholesterol: 185 mg/dL (ref <200)
HDL: 49 mg/dL — ABNORMAL LOW (ref >=50)
LDL Cholesterol (Calc): 119 mg/dL (calc) — ABNORMAL HIGH
Non-HDL Cholesterol (Calc): 136 mg/dL (calc) — ABNORMAL HIGH (ref <130)
Total CHOL/HDL Ratio: 3.8 (calc) (ref <5.0)
Triglycerides: 74 mg/dL (ref <150)

## 2020-09-02 LAB — CBC WITH DIFFERENTIAL/PLATELET
Absolute Monocytes: 737 cells/uL (ref 200–950)
Basophils Absolute: 29 cells/uL (ref 0–200)
Basophils Relative: 0.4 %
Eosinophils Absolute: 146 cells/uL (ref 15–500)
Eosinophils Relative: 2 %
HCT: 42.6 % (ref 35.0–45.0)
Hemoglobin: 14 g/dL (ref 11.7–15.5)
Lymphs Abs: 1920 cells/uL (ref 850–3900)
MCH: 29.7 pg (ref 27.0–33.0)
MCHC: 32.9 g/dL (ref 32.0–36.0)
MCV: 90.3 fL (ref 80.0–100.0)
MPV: 11 fL (ref 7.5–12.5)
Monocytes Relative: 10.1 %
Neutro Abs: 4468 cells/uL (ref 1500–7800)
Neutrophils Relative %: 61.2 %
Platelets: 251 10*3/uL (ref 140–400)
RBC: 4.72 10*6/uL (ref 3.80–5.10)
RDW: 12.1 % (ref 11.0–15.0)
Total Lymphocyte: 26.3 %
WBC: 7.3 10*3/uL (ref 3.8–10.8)

## 2020-09-02 MED ORDER — OZEMPIC (1 MG/DOSE) 2 MG/1.5ML ~~LOC~~ SOPN: 1.0000 mg | PEN_INJECTOR | SUBCUTANEOUS | 5 refills | Status: DC

## 2020-09-02 NOTE — Progress Notes (Signed)
Subjective:    Patient ID: Kristi Rush, female    DOB: May 30, 1965, 55 y.o.   MRN: [id number]  HPI  Patient is a very pleasant 55 year old Caucasian female who presents today for a follow-up of her essential hypertension.  She also has obesity with a BMI of 34.  She has 2 comorbidities related to her obesity including hypertension as well as osteoarthritis in her right knee.  Her orthopedist is recommended a knee replacement in the right knee which she is trying to put off as best she can.  She has a Baker's cyst in the right knee and palpable effusion today on exam.  She complains of pain in the right knee however she is trying to exercise and walk through the pain to achieve weight loss.  She is currently on 0.5 mg of Ozempic a week.  She has been on that for 2 weeks now.  She denies any nausea or vomiting however she is only lost 6 pounds since starting the Ozempic.  She is interested in trying to push the dose further.  Her mammogram and Pap smear performed by her gynecologist.  She is on Fosamax for osteoporosis also managed by her gynecologist however she is not taking calcium or vitamin D. Past Medical History:  Diagnosis Date  . H/O osteopenia   . Headache   . Hypertension   . Osteoporosis   . Squamous cell carcinoma of skin 2013   left mid back tx-cx3 48fu   Past Surgical History:  Procedure Laterality Date  . COLONOSCOPY N/A 02/23/2018   Procedure: COLONOSCOPY;  Surgeon: Rogene Houston, MD;  [facility];  Service: Endoscopy;  Laterality: N/A;  730  . WISDOM TOOTH EXTRACTION     many year ago   Current Outpatient Medications on File Prior to Visit  Medication Sig Dispense Refill  . nebivolol (BYSTOLIC) 10 MG tablet Take 1 tablet (10 mg total) by mouth daily. 90 tablet 1  . alendronate (FOSAMAX) 70 MG tablet Take 70 mg by mouth once a week. Take with a full glass of water on an empty stomach. (Patient not taking: Reported on 09/02/2020)     No current  facility-administered medications on file prior to visit.   No Known Allergies Social History   Socioeconomic History  . Marital status: Married    Spouse name: Not on file  . Number of children: Not on file  . Years of education: Not on file  . Highest education level: Not on file  Occupational History  . 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 Smoking status: Never Smoker  . Smokeless tobacco: Never Used  Vaping Use  . Vaping Use: Never used  Substance and Sexual Activity  . Alcohol use: No  . Drug use: No  . Sexual activity: Not on file  Other Topics Concern  . Not on file  Social History Narrative  . Not on file   Social Determinants of Health   Financial Resource Strain: Not on file  Food Insecurity: Not on file  Transportation Needs: Not on file  Physical Activity: Not on file  Stress: Not on file  Social Connections: Not on file  Intimate Partner Violence: Not on file     Review of Systems  All other systems reviewed and are negative.      Objective:   Physical Exam Vitals reviewed.  Constitutional:      General:  She is not in acute distress.    Appearance: Normal appearance. She is obese. She is not ill-appearing or toxic-appearing.  Cardiovascular:     Rate and Rhythm: Normal rate and regular rhythm.     Heart sounds: Normal heart sounds. No murmur heard. No friction rub. No gallop.   Pulmonary:     Effort: Pulmonary effort is normal. No respiratory distress.     Breath sounds: Normal breath sounds. No stridor. No wheezing.  Musculoskeletal:     Right knee: Effusion present. Decreased range of motion. Tenderness present over the medial joint line and lateral joint line.     Right lower leg: Edema present.     Left lower leg: Edema present.  Neurological:     Mental Status: She is alert.           Assessment & Plan:  Essential hypertension - Plan: COMPLETE METABOLIC PANEL WITH GFR,  Lipid panel, CBC with Differential/Platelet  Obesity (BMI 30-39.9) - Plan: COMPLETE METABOLIC PANEL WITH GFR, Lipid panel, CBC with Differential/Platelet  Patient has obesity with a BMI of 34.  She has 2 medical comorbidities related to obesity including essential hypertension as well as osteoarthritis in the right knee.  We will increase Ozempic to 1 mg subcu weekly in an effort to achieve better weight loss.  I also recommended that she change her exercise.  I would prefer the patient to use a stationary bike or an elliptical machine to prevent exacerbating the osteoarthritis in her knee.  Based on her exam and her history, it sounds like the osteoarthritis is end-stage and therefore I believe that the repeated irritation from walking will only exacerbate her pain.  Her blood pressure today is well controlled.  Check CBC, CMP, lipid panel

## 2020-09-05 ENCOUNTER — Encounter: Payer: Self-pay | Admitting: *Deleted

## 2021-02-20 ENCOUNTER — Other Ambulatory Visit: Payer: Self-pay | Admitting: Family Medicine

## 2021-07-04 DIAGNOSIS — Z9889 Other specified postprocedural states: Secondary | ICD-10-CM | POA: Insufficient documentation

## 2021-07-12 ENCOUNTER — Other Ambulatory Visit: Payer: Self-pay | Admitting: Family Medicine

## 2021-07-12 ENCOUNTER — Other Ambulatory Visit: Payer: Self-pay | Admitting: Obstetrics and Gynecology

## 2021-07-12 DIAGNOSIS — I10 Essential (primary) hypertension: Secondary | ICD-10-CM

## 2021-07-12 DIAGNOSIS — Z803 Family history of malignant neoplasm of breast: Secondary | ICD-10-CM

## 2021-08-11 ENCOUNTER — Other Ambulatory Visit: Payer: Self-pay

## 2021-08-11 ENCOUNTER — Ambulatory Visit
Admission: RE | Admit: 2021-08-11 | Discharge: 2021-08-11 | Disposition: A | Discharge status: Home / Self Care | Payer: BC Managed Care – PPO | Source: Ambulatory Visit | Attending: Obstetrics and Gynecology | Admitting: Obstetrics and Gynecology

## 2021-08-11 DIAGNOSIS — Z803 Family history of malignant neoplasm of breast: Secondary | ICD-10-CM

## 2021-08-11 MED ORDER — GADOBUTROL 1 MMOL/ML IV SOLN
7.0000 mL | Freq: Once | INTRAVENOUS | Status: AC | PRN
  Administered 2021-08-11: 7 mL via INTRAVENOUS

## 2021-08-12 ENCOUNTER — Other Ambulatory Visit: Payer: Self-pay | Admitting: Family Medicine

## 2021-08-13 NOTE — Telephone Encounter (Signed)
My sent to pt in Mychart regarding dose increase. ?

## 2021-08-13 NOTE — Telephone Encounter (Signed)
Per pt msg she agree to the '2mg'$  Ozempic. Rx place ?

## 2021-12-08 ENCOUNTER — Encounter: Payer: Self-pay | Admitting: Family Medicine

## 2021-12-08 ENCOUNTER — Ambulatory Visit: Payer: BC Managed Care – PPO | Admitting: Family Medicine

## 2021-12-08 VITALS — BP 122/82 | HR 63 | Temp 97.6°F | Ht 62.0 in | Wt 168.6 lb

## 2021-12-08 DIAGNOSIS — M1711 Unilateral primary osteoarthritis, right knee: Secondary | ICD-10-CM | POA: Diagnosis not present

## 2021-12-08 DIAGNOSIS — I10 Essential (primary) hypertension: Secondary | ICD-10-CM | POA: Diagnosis not present

## 2021-12-08 DIAGNOSIS — E669 Obesity, unspecified: Secondary | ICD-10-CM

## 2021-12-08 DIAGNOSIS — R739 Hyperglycemia, unspecified: Secondary | ICD-10-CM | POA: Diagnosis not present

## 2021-12-08 DIAGNOSIS — Z8739 Personal history of other diseases of the musculoskeletal system and connective tissue: Secondary | ICD-10-CM

## 2021-12-08 MED ORDER — NEBIVOLOL HCL 10 MG PO TABS: 10.0000 mg | ORAL_TABLET | Freq: Every day | ORAL | 3 refills | Status: DC

## 2021-12-08 NOTE — Progress Notes (Signed)
Subjective:    Patient ID: Kristi Rush, female    DOB: 07-30-1965, 56 y.o.   MRN: [id number]  Medication Refill   Patient is a very sweet 56 year old Caucasian female who has a history of hypertension as well as an elevated BMI.  Her BMI was as high as 34.  However she has successfully lost weight 268 pounds which gives her a BMI of 30.  She attributes this to taking Ozempic.  She is doing well on the medication and does not want to make any changes.: Wt Readings from Last 3 Encounters:  12/08/21 168 lb 9.6 oz (76.5 kg)  09/02/20 187 lb (84.8 kg)  06/26/20 191 lb (86.6 kg)   She also has a history of an elevated blood sugar in the past however she is not a prediabetic.  Her last A1c was 5.2.  Her blood pressure today is outstanding at 122/82.  She denies any side effects from the diastolic.  Her gynecologist is performing her mammogram and her Pap smear.  Her colonoscopy is up-to-date.  She is due for shingles shot.  She states that she had an abnormal bone density in the past however she cannot take "medicine" that her gynecologist recommended.  Therefore I recommended strongly that she take 1200 mg a day of calcium and 1000 units a day of vitamin D Past Medical History:  Diagnosis Date   H/O osteopenia    Headache    Hypertension    Osteoporosis    Squamous cell carcinoma of skin 2013   left mid back tx-cx3 64f   Past Surgical History:  Procedure Laterality Date   COLONOSCOPY N/A 02/23/2018   Procedure: COLONOSCOPY;  Surgeon: RRogene Houston MD;  [facility];  Service: Endoscopy;  Laterality: N/A;  [place]    many year ago   Current Outpatient Medications on File Prior to Visit  Medication Sig Dispense Refill   Semaglutide, 2 MG/DOSE, 8 MG/3ML SOPN Inject 2 mg as directed once a week. 3 mL 5   alendronate (FOSAMAX) 70 MG tablet Take 70 mg by mouth once a week. Take with a full glass of water on an empty stomach. (Patient not taking: Reported on  09/02/2020)     No current facility-administered medications on file prior to visit.   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  Tobacco Use   Smoking status: Never   Smokeless tobacco: Never  Vaping Use   Vaping Use: Never used  Substance and Sexual Activity   Alcohol use: No   Drug use: No   Sexual activity: Not on file  Other Topics Concern   Not on file  Social History Narrative   Not on file   Social Determinants of Health   Financial Resource Strain: Not on file  Food Insecurity: Not on file  Transportation Needs: Not on file  Physical Activity: Not on file  Stress: Not on file  Social Connections: Not on file  Intimate Partner Violence: Not on file     Review of Systems  All other systems reviewed and are negative.      Objective:  Physical Exam Vitals reviewed.  Constitutional:      General: She is not in acute distress.    Appearance: Normal appearance. She is obese. She is not ill-appearing or toxic-appearing.  Cardiovascular:     Rate and Rhythm: Normal rate and regular rhythm.     Heart sounds: Normal heart sounds. No murmur heard.    No friction rub. No gallop.  Pulmonary:     Effort: Pulmonary effort is normal. No respiratory distress.     Breath sounds: Normal breath sounds. No stridor. No wheezing.  Musculoskeletal:     Right knee: Effusion present. Decreased range of motion. Tenderness present over the medial joint line and lateral joint line.     Right lower leg: Edema present.     Left lower leg: Edema present.  Neurological:     Mental Status: She is alert.           Assessment & Plan:  Elevated blood sugar level - Plan: CBC with  Differential/Platelet, Lipid panel, COMPLETE METABOLIC PANEL WITH GFR  Essential hypertension - Plan: nebivolol (BYSTOLIC) 10 MG tablet  Obesity (BMI 30-39.9)  Primary osteoarthritis of right knee  H/O osteopenia Blood pressure is outstanding.  We will continue Bystolic.  The patient has successfully lost almost 30 pounds on Ozempic so we will make no changes in this as the patient would like to continue it.  Check CBC CMP and a lipid panel.  Goal LDL cholesterol is less than 100.  I did recommend 1200 mg a day of calcium and 1000 units a day of vitamin D due to her history of osteopenia.  I will defer her mammogram and her Pap smear to her gynecologist who is also performing her bone density.  Her colonoscopy is up-to-date.  I did recommend the shingles vaccine.

## 2021-12-09 LAB — CBC WITH DIFFERENTIAL/PLATELET
Absolute Monocytes: 622 cells/uL (ref 200–950)
Basophils Absolute: 28 cells/uL (ref 0–200)
Basophils Relative: 0.5 %
Eosinophils Absolute: 83 cells/uL (ref 15–500)
Eosinophils Relative: 1.5 %
HCT: 42.2 % (ref 35.0–45.0)
Hemoglobin: 14.3 g/dL (ref 11.7–15.5)
Lymphs Abs: 1700 cells/uL (ref 850–3900)
MCH: 30.8 pg (ref 27.0–33.0)
MCHC: 33.9 g/dL (ref 32.0–36.0)
MCV: 90.8 fL (ref 80.0–100.0)
MPV: 11 fL (ref 7.5–12.5)
Monocytes Relative: 11.3 %
Neutro Abs: 3069 cells/uL (ref 1500–7800)
Neutrophils Relative %: 55.8 %
Platelets: 250 10*3/uL (ref 140–400)
RBC: 4.65 10*6/uL (ref 3.80–5.10)
RDW: 12.3 % (ref 11.0–15.0)
Total Lymphocyte: 30.9 %
WBC: 5.5 10*3/uL (ref 3.8–10.8)

## 2021-12-09 LAB — COMPLETE METABOLIC PANEL WITH GFR
AG Ratio: 1.6 (calc) (ref 1.0–2.5)
ALT: 11 U/L (ref 6–29)
AST: 13 U/L (ref 10–35)
Albumin: 4.1 g/dL (ref 3.6–5.1)
Alkaline phosphatase (APISO): 65 U/L (ref 37–153)
BUN: 11 mg/dL (ref 7–25)
CO2: 27 mmol/L (ref 20–32)
Calcium: 9.6 mg/dL (ref 8.6–10.4)
Chloride: 106 mmol/L (ref 98–110)
Creat: 0.8 mg/dL (ref 0.50–1.03)
Globulin: 2.6 g/dL (calc) (ref 1.9–3.7)
Glucose, Bld: 80 mg/dL (ref 65–99)
Potassium: 4.8 mmol/L (ref 3.5–5.3)
Sodium: 142 mmol/L (ref 135–146)
Total Bilirubin: 0.4 mg/dL (ref 0.2–1.2)
Total Protein: 6.7 g/dL (ref 6.1–8.1)
eGFR: 86 mL/min/{1.73_m2} (ref >=60)

## 2021-12-09 LAB — LIPID PANEL
Cholesterol: 188 mg/dL (ref <200)
HDL: 53 mg/dL (ref >=50)
LDL Cholesterol (Calc): 116 mg/dL (calc) — ABNORMAL HIGH
Non-HDL Cholesterol (Calc): 135 mg/dL (calc) — ABNORMAL HIGH (ref <130)
Total CHOL/HDL Ratio: 3.5 (calc) (ref <5.0)
Triglycerides: 90 mg/dL (ref <150)

## 2021-12-29 ENCOUNTER — Telehealth: Payer: Self-pay

## 2021-12-29 NOTE — Telephone Encounter (Signed)
PA -Ozempic sent to plan

## 2021-12-29 NOTE — Telephone Encounter (Signed)
[facility]  received a request from your provider for coverage of Ozempic (2 MG/DOSE) '8MG'$ /3ML [place] SOPN.   The request was DENIED because: Your plan only covers this drug when it is used for certain health conditions. Covered use is for type 2 diabetes mellitus. Your plan does not cover the drug for your health condition that your doctor told us you have.

## 2021-12-30 ENCOUNTER — Telehealth: Payer: Self-pay

## 2021-12-30 MED ORDER — PHENTERMINE HCL 37.5 MG PO TABS: 37.5000 mg | ORAL_TABLET | Freq: Every day | ORAL | 0 refills | Status: DC

## 2021-12-30 NOTE — Addendum Note (Signed)
Addended by: Colman Cater on: 12/30/2021 02:46 PM   Modules accepted: Orders

## 2021-12-30 NOTE — Telephone Encounter (Addendum)
Called pt, Per Dr. Dennard Schaumann pt can try Adipex short term (90 days).  Agree, per pt. To try adipex

## 2021-12-30 NOTE — Telephone Encounter (Signed)
Pt was denied for Ozempic, pt's insurance will not covered.  Pt asked for other alternatives. Pls advice

## 2021-12-30 NOTE — Telephone Encounter (Signed)
Called pt, PA for Ozempic has been denied.

## 2022-01-07 ENCOUNTER — Encounter: Payer: Self-pay | Admitting: Family Medicine

## 2022-01-07 NOTE — Telephone Encounter (Addendum)
Was unable to call Rx- Adipex to pharmacy.   What strength would you like pt to be on the 90 day (short term).  Please advice

## 2022-01-08 ENCOUNTER — Other Ambulatory Visit: Payer: Self-pay | Admitting: Family Medicine

## 2022-01-08 MED ORDER — PHENTERMINE HCL 37.5 MG PO CAPS: 37.5000 mg | ORAL_CAPSULE | ORAL | 2 refills | Status: DC

## 2022-02-01 ENCOUNTER — Encounter: Payer: Self-pay | Admitting: Family Medicine

## 2022-02-03 ENCOUNTER — Ambulatory Visit: Payer: BC Managed Care – PPO | Admitting: Family Medicine

## 2022-02-03 VITALS — BP 130/72 | HR 63 | Ht 62.0 in | Wt 172.2 lb

## 2022-02-03 DIAGNOSIS — K120 Recurrent oral aphthae: Secondary | ICD-10-CM

## 2022-02-03 MED ORDER — TRIAMCINOLONE ACETONIDE 0.1 % MT PSTE
1.0000 | PASTE | Freq: Two times a day (BID) | OROMUCOSAL | 12 refills | Status: DC
Start: 2022-02-03 — End: 2023-12-07

## 2022-02-03 NOTE — Progress Notes (Signed)
Subjective:    Patient ID: Kristi Rush, female    DOB: Sep 28, 1965, 56 y.o.   MRN: [id number]  HPI    Insurance refused the GLP-1 medicine so we switched the patient from phentermine at her request.  She has been monitoring her blood pressure and its been well controlled.  Today is 130/72.  However she has gained 4 pounds since her last visit despite taking phentermine.  She believes that she is having allergic reaction.  She recently developed ulcers on her tongue and in her mouth and on her inside lower lip.  They are healing now but what she describes sounds like canker sores or aphthous ulcers.  There is no evidence of Stevens-Johnson syndrome or any rash on the external part of the body.  She denies any genital blisters.  She is still taking the phentermine and the rash is subsiding however the blisters in her mouth were very sore for several days. Past Medical History:  Diagnosis Date   H/O osteopenia    Headache    Hypertension    Osteoporosis    Squamous cell carcinoma of skin 2013   left mid back tx-cx3 73f   Past Surgical History:  Procedure Laterality Date   COLONOSCOPY N/A 02/23/2018   Procedure: COLONOSCOPY;  Surgeon: RRogene Houston MD;  [facility];  Service: Endoscopy;  Laterality: N/A;  [place]    many year ago   Current Outpatient Medications on File Prior to Visit  Medication Sig Dispense Refill   nebivolol (BYSTOLIC) 10 MG tablet Take 1 tablet (10 mg total) by mouth daily. 90 tablet 3   phentermine 37.5 MG capsule Take 1 capsule (37.5 mg total) by mouth every morning. (Patient not taking: Reported on 02/03/2022) 30 capsule 2   No current facility-administered medications on file prior to visit.   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  Tobacco Use   Smoking status: Never   Smokeless tobacco: Never  Vaping Use   Vaping Use: Never used  Substance and Sexual Activity   Alcohol use: No   Drug use: No   Sexual activity: Not on file  Other Topics Concern   Not on file  Social History Narrative   Not on file   Social Determinants of Health   Financial Resource Strain: Not on file  Food Insecurity: Not on file  Transportation Needs: Not on file  Physical Activity: Not on file  Stress: Not on file  Social Connections: Not on file  Intimate Partner Violence: Not on file    Review of Systems  All other systems reviewed and are negative.      Objective:   Physical Exam Vitals reviewed.  Constitutional:      Appearance: Normal appearance.  HENT:     Right Ear: Tympanic membrane and ear canal normal.     Left Ear: Tympanic membrane and ear canal normal.     Mouth/Throat:     Mouth: Mucous membranes are moist.     Pharynx: Oropharynx is clear. No oropharyngeal exudate or posterior oropharyngeal erythema.  Cardiovascular:     Rate  and Rhythm: Normal rate and regular rhythm.     Heart sounds: Normal heart sounds. No murmur heard. Pulmonary:     Effort: Pulmonary effort is normal. No respiratory distress.     Breath sounds: Normal breath sounds. No stridor.  Lymphadenopathy:     Cervical: No cervical adenopathy.  Skin:    Findings: No rash.  Neurological:     Mental Status: She is alert.           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  I will give the patient triamcinolone dental paste to be applied twice daily as needed in the future if this reoccurs.  I do not believe that this was a reaction to the phentermine.  However the patient elects to stop the phentermine.

## 2022-04-01 ENCOUNTER — Telehealth: Payer: Self-pay

## 2022-04-01 NOTE — Telephone Encounter (Signed)
PA FOR OZEMPIC WAS DENIED BECAUSE PT IS NOT DIABETIC. PT ADVISED VIA MY CHART MESSAGE.

## 2022-04-16 ENCOUNTER — Encounter: Payer: Self-pay | Admitting: Family Medicine

## 2022-04-17 ENCOUNTER — Telehealth: Payer: Self-pay

## 2022-04-17 NOTE — Telephone Encounter (Signed)
MY CHART MESSAGE FROM PATIENT:   I was able to verify the insurance  coverage availability for both Saxenda and/or Wegovy.  Both are covered with prior authorization.  The provided number for doing so is [place].   Thanks!  I advised pt that Mancel Parsons is on national back order until January. Thank you.

## 2022-04-20 ENCOUNTER — Other Ambulatory Visit: Payer: Self-pay | Admitting: Family Medicine

## 2022-04-20 MED ORDER — SAXENDA 18 MG/3ML ~~LOC~~ SOPN: 0.6000 mg | PEN_INJECTOR | Freq: Every day | SUBCUTANEOUS | 0 refills | Status: DC

## 2022-04-21 ENCOUNTER — Other Ambulatory Visit: Payer: Self-pay | Admitting: Family Medicine

## 2022-04-21 MED ORDER — SAXENDA 18 MG/3ML ~~LOC~~ SOPN
0.6000 mg | PEN_INJECTOR | Freq: Every day | SUBCUTANEOUS | 0 refills | Status: DC
Start: 2022-04-21 — End: 2023-04-26

## 2022-04-30 ENCOUNTER — Other Ambulatory Visit: Payer: Self-pay

## 2022-04-30 DIAGNOSIS — R739 Hyperglycemia, unspecified: Secondary | ICD-10-CM

## 2022-04-30 DIAGNOSIS — E669 Obesity, unspecified: Secondary | ICD-10-CM

## 2022-04-30 MED ORDER — INSULIN PEN NEEDLE 30G X 8 MM MISC: 5 refills | Status: DC

## 2022-04-30 MED ORDER — INSULIN PEN NEEDLE 30G X 8 MM MISC: 5 refills | Status: AC

## 2022-06-03 ENCOUNTER — Encounter: Payer: Self-pay | Admitting: Family Medicine

## 2022-06-05 ENCOUNTER — Other Ambulatory Visit: Payer: Self-pay

## 2022-06-05 DIAGNOSIS — I10 Essential (primary) hypertension: Secondary | ICD-10-CM

## 2022-06-05 DIAGNOSIS — E669 Obesity, unspecified: Secondary | ICD-10-CM

## 2022-06-05 DIAGNOSIS — R739 Hyperglycemia, unspecified: Secondary | ICD-10-CM

## 2022-06-05 MED ORDER — SAXENDA 18 MG/3ML ~~LOC~~ SOPN: 1.2000 mg | PEN_INJECTOR | Freq: Every day | SUBCUTANEOUS | 1 refills | Status: DC

## 2022-07-21 ENCOUNTER — Other Ambulatory Visit: Payer: Self-pay | Admitting: Obstetrics and Gynecology

## 2022-07-21 DIAGNOSIS — Z803 Family history of malignant neoplasm of breast: Secondary | ICD-10-CM

## 2022-07-30 ENCOUNTER — Encounter: Payer: Self-pay | Admitting: Family Medicine

## 2022-08-18 ENCOUNTER — Other Ambulatory Visit: Payer: BC Managed Care – PPO

## 2022-11-03 ENCOUNTER — Ambulatory Visit: Payer: BC Managed Care – PPO | Admitting: Family Medicine

## 2022-11-03 ENCOUNTER — Encounter: Payer: Self-pay | Admitting: Family Medicine

## 2022-11-03 VITALS — BP 120/70 | HR 52 | Temp 98.1°F | Ht 62.0 in | Wt 191.6 lb

## 2022-11-03 DIAGNOSIS — L659 Nonscarring hair loss, unspecified: Secondary | ICD-10-CM | POA: Diagnosis not present

## 2022-11-03 NOTE — Progress Notes (Signed)
Subjective:    Patient ID: Kristi Rush, female    DOB: 1966-01-20, 57 y.o.   MRN: [id number]  HPI  Patient reports diffuse thinning of her hair.  Is primarily on her temples and in the center of her scalp.  This was witnessed by her hairdresser and recommended that she get tested.  She denies any facial rash or patches of alopecia to suggest discoid lupus.  She denies any fevers or chills.  She does report continued weight gain. Past Medical History:  Diagnosis Date   H/O osteopenia    Headache    Hypertension    Osteoporosis    Squamous cell carcinoma of skin 2013   left mid back tx-cx3 26fu   Past Surgical History:  Procedure Laterality Date   COLONOSCOPY N/A 02/23/2018   Procedure: COLONOSCOPY;  Surgeon: Malissa Hippo, MD;  [facility];  Service: Endoscopy;  Laterality: N/A;  730   WISDOM TOOTH EXTRACTION     many year ago   Current Outpatient Medications on File Prior to Visit  Medication Sig Dispense Refill   B-D ULTRAFINE III SHORT PEN 31G X 8 MM MISC SMARTSIG:Injection Daily     Insulin Pen Needle (NOVOFINE) 30G X 8 MM MISC Use to inject medication daily. 100 each 5   Liraglutide -Weight Management (SAXENDA) 18 MG/3ML SOPN Inject 0.6 mg into the skin daily. 3 mL 0   Liraglutide -Weight Management (SAXENDA) 18 MG/3ML SOPN Inject 1.2 mg into the skin daily. 3 mL 1   nebivolol (BYSTOLIC) 10 MG tablet Take 1 tablet (10 mg total) by mouth daily. 90 tablet 3   phentermine 37.5 MG capsule Take 1 capsule (37.5 mg total) by mouth every morning. (Patient not taking: Reported on 02/03/2022) 30 capsule 2   triamcinolone (KENALOG) 0.1 % paste Use as directed 1 Application in the mouth or throat 2 (two) times daily. 5 g 12   No current facility-administered medications on file prior to visit.   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  Tobacco Use   Smoking status: Never   Smokeless tobacco: Never  Vaping Use   Vaping Use: Never used  Substance and Sexual Activity   Alcohol use: No   Drug use: No   Sexual activity: Not on file  Other Topics Concern   Not on file  Social History Narrative   Not on file   Social Determinants of Health   Financial Resource Strain: Not on file  Food Insecurity: Not on file  Transportation Needs: Not on file  Physical Activity: Not on file  Stress: Not on file  Social Connections: Not on file  Intimate Partner Violence: Not on file    Review of Systems  All other systems reviewed and are negative.      Objective:   Physical Exam Vitals reviewed.  Constitutional:      Appearance: Normal appearance.  HENT:     Right Ear: Tympanic membrane and ear canal normal.     Left Ear: Tympanic membrane and ear canal normal.     Mouth/Throat:     Mouth: Mucous membranes are moist.  Pharynx: Oropharynx is clear. No oropharyngeal exudate or posterior oropharyngeal erythema.  Cardiovascular:     Rate and Rhythm: Normal rate and regular rhythm.     Heart sounds: Normal heart sounds. No murmur heard. Pulmonary:     Effort: Pulmonary effort is normal. No respiratory distress.     Breath sounds: Normal breath sounds. No stridor.  Lymphadenopathy:     Cervical: No cervical adenopathy.  Skin:    Findings: No rash.  Neurological:     Mental Status: She is alert.           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  CBC CMP TSH T3 and T4.  If labs are normal, the patient could try Rogaine versus Propecia

## 2022-11-04 ENCOUNTER — Encounter: Payer: Self-pay | Admitting: Family Medicine

## 2022-11-04 LAB — CBC WITH DIFFERENTIAL/PLATELET
Absolute Monocytes: 567 cells/uL (ref 200–950)
Basophils Absolute: 39 cells/uL (ref 0–200)
Basophils Relative: 0.7 %
Eosinophils Absolute: 88 cells/uL (ref 15–500)
Eosinophils Relative: 1.6 %
HCT: 41.7 % (ref 35.0–45.0)
Hemoglobin: 14.2 g/dL (ref 11.7–15.5)
Lymphs Abs: 1606 cells/uL (ref 850–3900)
MCH: 30.9 pg (ref 27.0–33.0)
MCHC: 34.1 g/dL (ref 32.0–36.0)
MCV: 90.7 fL (ref 80.0–100.0)
MPV: 11.5 fL (ref 7.5–12.5)
Monocytes Relative: 10.3 %
Neutro Abs: 3201 cells/uL (ref 1500–7800)
Neutrophils Relative %: 58.2 %
Platelets: 232 10*3/uL (ref 140–400)
RBC: 4.6 10*6/uL (ref 3.80–5.10)
RDW: 12.2 % (ref 11.0–15.0)
Total Lymphocyte: 29.2 %
WBC: 5.5 10*3/uL (ref 3.8–10.8)

## 2022-11-04 LAB — COMPLETE METABOLIC PANEL WITH GFR
AG Ratio: 1.6 (calc) (ref 1.0–2.5)
ALT: 18 U/L (ref 6–29)
AST: 17 U/L (ref 10–35)
Albumin: 4.1 g/dL (ref 3.6–5.1)
Alkaline phosphatase (APISO): 60 U/L (ref 37–153)
BUN: 17 mg/dL (ref 7–25)
CO2: 31 mmol/L (ref 20–32)
Calcium: 9.6 mg/dL (ref 8.6–10.4)
Chloride: 104 mmol/L (ref 98–110)
Creat: 0.8 mg/dL (ref 0.50–1.03)
Globulin: 2.6 g/dL (calc) (ref 1.9–3.7)
Glucose, Bld: 70 mg/dL (ref 65–99)
Potassium: 4.5 mmol/L (ref 3.5–5.3)
Sodium: 141 mmol/L (ref 135–146)
Total Bilirubin: 0.5 mg/dL (ref 0.2–1.2)
Total Protein: 6.7 g/dL (ref 6.1–8.1)
eGFR: 86 mL/min/{1.73_m2} (ref >=60)

## 2022-11-04 LAB — T3, FREE: T3, Free: 3.2 pg/mL (ref 2.3–4.2)

## 2022-11-04 LAB — T4, FREE: Free T4: 1.2 ng/dL (ref 0.8–1.8)

## 2022-11-04 LAB — TSH: TSH: 1.44 mIU/L (ref 0.40–4.50)

## 2023-01-03 ENCOUNTER — Encounter: Payer: Self-pay | Admitting: Family Medicine

## 2023-01-06 ENCOUNTER — Other Ambulatory Visit: Payer: Self-pay | Admitting: Family Medicine

## 2023-01-07 NOTE — Telephone Encounter (Signed)
Requested medication (s) are due for refill today: no  Requested medication (s) are on the active medication list: no   Last refill:  discontinued 09/11/2013  Future visit scheduled: no   Notes to clinic:  last OV 11/03/22 medication discontinued 09/11/2013. Do you want to renew Rx? Requested by pharmacy      Requested Prescriptions  Pending Prescriptions Disp Refills   ondansetron (ZOFRAN-ODT) 4 MG disintegrating tablet [Pharmacy Med Name: ondansetron 4 mg disintegrating tablet] 30 tablet 0    Sig: PLACE 1 TABLET ON THE TONGUE AND ALLOW TO DISSOLVE THEN SWALLOW EVERY 8 HOURS AS NEEDED FOR NAUSEA     Not Delegated - Gastroenterology: Antiemetics - ondansetron Failed - 01/06/2023  4:21 PM      Failed - This refill cannot be delegated      Failed - Valid encounter within last 6 months    Recent Outpatient Visits           2 years ago Essential hypertension   [facility] Donita Brooks, MD   2 years ago Essential hypertension   [facility] [place], Velna Hatchet, MD   2 years ago Essential hypertension   [facility] [place], Velna Hatchet, MD   2 years ago Polyarthralgia   [facility] [place], Velna Hatchet, MD   3 years ago Annual physical exam   [facility] Jenne Pane, Aggie Cosier A, FNP              Passed - AST in normal range and within 360 days    AST  Date Value Ref Range Status  11/03/2022 17 10 - 35 U/L Final         Passed - ALT in normal range and within 360 days    ALT  Date Value Ref Range Status  11/03/2022 18 6 - 29 U/L Final

## 2023-01-12 ENCOUNTER — Other Ambulatory Visit: Payer: Self-pay

## 2023-01-23 ENCOUNTER — Other Ambulatory Visit: Payer: Self-pay | Admitting: Family Medicine

## 2023-01-23 DIAGNOSIS — I10 Essential (primary) hypertension: Secondary | ICD-10-CM

## 2023-01-25 NOTE — Telephone Encounter (Signed)
Requested medications are due for refill today.  unsure  Requested medications are on the active medications list.  yes  Last refill. 12/08/2021 #90 3 rf  Future visit scheduled.   no  Notes to clinic.  Rx written to expire 12/03/2022 - Rx is expired.    Requested Prescriptions  Pending Prescriptions Disp Refills   nebivolol (BYSTOLIC) 10 MG tablet [Pharmacy Med Name: Nebivolol HCl Oral Tablet 10 MG] 90 tablet 0    Sig: TAKE ONE TABLET BY MOUTH ONE TIME DAILY     Cardiovascular: Beta Blockers 3 Failed - 01/23/2023  5:03 PM      Failed - Valid encounter within last 6 months    Recent Outpatient Visits           2 years ago Essential hypertension   [facility] [place], Priscille Heidelberg, MD   2 years ago Essential hypertension   [facility] [place], Velna Hatchet, MD   2 years ago Essential hypertension   [facility] [place], Velna Hatchet, MD   2 years ago Polyarthralgia   [facility] [place], Velna Hatchet, MD   3 years ago Annual physical exam   [facility] Elmore Guise, FNP              Passed - Cr in normal range and within 360 days    Creat  Date Value Ref Range Status  11/03/2022 0.80 0.50 - 1.03 mg/dL Final         Passed - AST in normal range and within 360 days    AST  Date Value Ref Range Status  11/03/2022 17 10 - 35 U/L Final         Passed - ALT in normal range and within 360 days    ALT  Date Value Ref Range Status  11/03/2022 18 6 - 29 U/L Final         Passed - Last BP in normal range    BP Readings from Last 1 Encounters:  11/03/22 120/70         Passed - Last Heart Rate in normal range    Pulse Readings from Last 1 Encounters:  11/03/22 (!) 52

## 2023-01-28 ENCOUNTER — Other Ambulatory Visit: Payer: Self-pay | Admitting: Family Medicine

## 2023-01-28 DIAGNOSIS — I10 Essential (primary) hypertension: Secondary | ICD-10-CM

## 2023-02-01 ENCOUNTER — Other Ambulatory Visit: Payer: Self-pay

## 2023-02-01 DIAGNOSIS — I10 Essential (primary) hypertension: Secondary | ICD-10-CM

## 2023-02-01 MED ORDER — NEBIVOLOL HCL 10 MG PO TABS: 10.0000 mg | ORAL_TABLET | Freq: Every day | ORAL | 1 refills | Status: DC

## 2023-02-01 NOTE — Telephone Encounter (Signed)
Received call to follow up on refill requested for nebivolol (BYSTOLIC) 10 MG tablet   Pharmacy advised patient they haven't received a reply to their refill request; **new script needed.  LOV 11/03/2022  Requesting for permanent pharmacy to be changed to [facility] and [facility].   Please advise at [phone]

## 2023-03-01 ENCOUNTER — Encounter: Payer: Self-pay | Admitting: Family Medicine

## 2023-03-02 ENCOUNTER — Other Ambulatory Visit: Payer: Self-pay

## 2023-03-02 DIAGNOSIS — I10 Essential (primary) hypertension: Secondary | ICD-10-CM

## 2023-03-02 MED ORDER — NEBIVOLOL HCL 10 MG PO TABS: 10.0000 mg | ORAL_TABLET | Freq: Every day | ORAL | 1 refills | Status: DC

## 2023-04-04 ENCOUNTER — Encounter: Payer: Self-pay | Admitting: Family Medicine

## 2023-04-05 ENCOUNTER — Other Ambulatory Visit: Payer: Self-pay

## 2023-04-05 DIAGNOSIS — I10 Essential (primary) hypertension: Secondary | ICD-10-CM

## 2023-04-05 MED ORDER — NEBIVOLOL HCL 10 MG PO TABS: 10.0000 mg | ORAL_TABLET | Freq: Every day | ORAL | 0 refills | Status: DC

## 2023-04-26 ENCOUNTER — Encounter: Payer: Self-pay | Admitting: Family Medicine

## 2023-04-26 ENCOUNTER — Ambulatory Visit: Payer: BC Managed Care – PPO | Admitting: Family Medicine

## 2023-04-26 VITALS — BP 122/88 | HR 63 | Temp 98.3°F | Ht 62.0 in | Wt 189.4 lb

## 2023-04-26 DIAGNOSIS — I10 Essential (primary) hypertension: Secondary | ICD-10-CM | POA: Diagnosis not present

## 2023-04-26 NOTE — Progress Notes (Signed)
Subjective:    Patient ID: Kristi Rush, female    DOB: [birth year], 57 y.o.   MRN: [id number]  HPI Patient is here today to get a refill on her Bystolic.  Her blood pressure is well-controlled at 122/88.  She denies any chest pain shortness of breath or dyspnea on exertion.  Her gynecologist is performing her Pap smears and her mammogram.  She due to check her cholesterol.  She had lab work 6 months ago that was completely normal.  She is currently on semaglutide 0.5 mg subcu weekly.  She would like to increase this dose as she is not achieving any additional weight loss Past Medical History:  Diagnosis Date   H/O osteopenia    Headache    Hypertension    Osteoporosis    Squamous cell carcinoma of skin 2013   left mid back [id number]   Past Surgical History:  Procedure Laterality Date   COLONOSCOPY N/A 02/23/2018   Procedure: COLONOSCOPY;  Surgeon: Malissa Hippo, MD;  [facility];  Service: Endoscopy;  Laterality: N/A;  730   WISDOM TOOTH EXTRACTION     many year ago   Current Outpatient Medications on File Prior to Visit  Medication Sig Dispense Refill   Insulin Pen Needle (NOVOFINE) 30G X 8 MM MISC Use to inject medication daily. 100 each 5   nebivolol (BYSTOLIC) 10 MG tablet Take 1 tablet (10 mg total) by mouth daily. 90 tablet 0   ondansetron (ZOFRAN-ODT) 4 MG disintegrating tablet PLACE 1 TABLET ON THE TONGUE AND ALLOW TO DISSOLVE THEN SWALLOW EVERY 8 HOURS AS NEEDED FOR NAUSEA 30 tablet 0   ondansetron (ZOFRAN-ODT) 4 MG disintegrating tablet Take by mouth.     triamcinolone (KENALOG) 0.1 % paste Use as directed 1 Application in the mouth or throat 2 (two) times daily. (Patient not taking: Reported on 04/26/2023) 5 g 12   No current facility-administered medications on file prior to visit.   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  Tobacco Use   Smoking status: Never   Smokeless tobacco: Never  Vaping Use   Vaping status: Never Used  Substance and Sexual Activity   Alcohol use: No   Drug use: No   Sexual activity: Not on file  Other Topics Concern   Not on file  Social History Narrative   Not on file   Social Determinants of Health   Financial Resource Strain: Not on file  Food Insecurity: Not on file  Transportation Needs: Not on file  Physical Activity: Not on file  Stress: Not on file  Social Connections: Not on file  Intimate Partner Violence: Not on file    Review of Systems  All other systems reviewed and are negative.      Objective:   Physical Exam Vitals reviewed.  Constitutional:      Appearance: Normal appearance.  HENT:     Right Ear: Tympanic membrane and ear canal normal.     Left Ear: Tympanic membrane and ear canal normal.     Mouth/Throat:     Mouth: Mucous membranes are moist.     Pharynx: Oropharynx  is clear. No oropharyngeal exudate or posterior oropharyngeal erythema.  Cardiovascular:     Rate and Rhythm: Normal rate and regular rhythm.     Heart sounds: Normal heart sounds. No murmur heard. Pulmonary:     Effort: Pulmonary effort is normal. No respiratory distress.     Breath sounds: Normal breath sounds. No stridor.  Lymphadenopathy:     Cervical: No cervical adenopathy.  Skin:    Findings: No rash.  Neurological:     Mental Status: She is alert.           Assessment & Plan:  Benign essential HTN - Plan: Lipid panel, COMPLETE METABOLIC PANEL WITH GFR Blood pressure is outstanding.  Continue nebivolol at his current dose.  Check CMP and lipid panel.  Increase semaglutide to 1 mg subcu weekly.

## 2023-04-27 LAB — LIPID PANEL
Cholesterol: 205 mg/dL — ABNORMAL HIGH (ref <200)
HDL: 49 mg/dL — ABNORMAL LOW (ref >=50)
LDL Cholesterol (Calc): 126 mg/dL — ABNORMAL HIGH
Non-HDL Cholesterol (Calc): 156 mg/dL — ABNORMAL HIGH (ref <130)
Total CHOL/HDL Ratio: 4.2 (calc) (ref <5.0)
Triglycerides: 186 mg/dL — ABNORMAL HIGH (ref <150)

## 2023-04-27 LAB — COMPLETE METABOLIC PANEL WITH GFR
AG Ratio: 1.5 (calc) (ref 1.0–2.5)
ALT: 16 U/L (ref 6–29)
AST: 16 U/L (ref 10–35)
Albumin: 4.1 g/dL (ref 3.6–5.1)
Alkaline phosphatase (APISO): 65 U/L (ref 37–153)
BUN: 13 mg/dL (ref 7–25)
CO2: 29 mmol/L (ref 20–32)
Calcium: 9.5 mg/dL (ref 8.6–10.4)
Chloride: 104 mmol/L (ref 98–110)
Creat: 0.91 mg/dL (ref 0.50–1.03)
Globulin: 2.8 g/dL (ref 1.9–3.7)
Glucose, Bld: 83 mg/dL (ref 65–99)
Potassium: 4.3 mmol/L (ref 3.5–5.3)
Sodium: 142 mmol/L (ref 135–146)
Total Bilirubin: 0.3 mg/dL (ref 0.2–1.2)
Total Protein: 6.9 g/dL (ref 6.1–8.1)
eGFR: 74 mL/min/{1.73_m2} (ref >=60)

## 2023-07-08 ENCOUNTER — Ambulatory Visit: Payer: 59 | Admitting: Family Medicine

## 2023-07-08 VITALS — BP 142/86 | HR 72 | Temp 98.0°F | Ht 62.0 in | Wt 185.6 lb

## 2023-07-08 DIAGNOSIS — L509 Urticaria, unspecified: Secondary | ICD-10-CM | POA: Diagnosis not present

## 2023-07-08 MED ORDER — ALPRAZOLAM 0.5 MG PO TABS: 0.5000 mg | ORAL_TABLET | Freq: Three times a day (TID) | ORAL | 0 refills | Status: AC | PRN

## 2023-07-08 MED ORDER — PREDNISONE 20 MG PO TABS: ORAL_TABLET | ORAL | 0 refills | Status: DC

## 2023-07-08 NOTE — Progress Notes (Signed)
 Subjective:    Patient ID: Kristi Rush, female    DOB: 07/23/1965, 58 y.o.   MRN: [id number]  HPI Patient just lost her mother.  She is extremely sad as would be expected.  She has developed urticaria circumferentially around her neck and on her upper chest.  It is a diffuse plaque-like urticaria.  Is also on her left wrist and lower abdomen.  She denies any contacts that she could be allergic to.  She denies any trouble swallowing.  There is no lip swelling or tongue swelling Past Medical History:  Diagnosis Date   H/O osteopenia    Headache    Hypertension    Osteoporosis    Squamous cell carcinoma of skin 2013   left mid back [id number] 47fu   Past Surgical History:  Procedure Laterality Date   COLONOSCOPY N/A 02/23/2018   Procedure: COLONOSCOPY;  Surgeon: Malissa Hippo, MD;  [facility]: [facility];  Service: Endoscopy;  Laterality: N/A;  730   WISDOM TOOTH EXTRACTION     many year ago   Current Outpatient Medications on File Prior to Visit  Medication Sig Dispense Refill   Insulin Pen Needle (NOVOFINE) 30G X 8 MM MISC Use to inject medication daily. 100 each 5   nebivolol (BYSTOLIC) 10 MG tablet Take 1 tablet (10 mg total) by mouth daily. 90 tablet 0   ondansetron (ZOFRAN-ODT) 4 MG disintegrating tablet PLACE 1 TABLET ON THE TONGUE AND ALLOW TO DISSOLVE THEN SWALLOW EVERY 8 HOURS AS NEEDED FOR NAUSEA 30 tablet 0   ondansetron (ZOFRAN-ODT) 4 MG disintegrating tablet Take by mouth.     triamcinolone (KENALOG) 0.1 % paste Use as directed 1 Application in the mouth or throat 2 (two) times daily. (Patient not taking: Reported on 04/26/2023) 5 g 12   No current facility-administered medications on file prior to visit.   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  Tobacco Use   Smoking status: Never   Smokeless tobacco: Never  Vaping Use   Vaping status: Never Used  Substance and Sexual Activity   Alcohol use: No   Drug use: No   Sexual activity: Not on file  Other Topics Concern   Not on file  Social History Narrative   Not on file   Social Drivers of Health   Financial Resource Strain: Low Risk  (07/08/2023)   Overall Financial Resource Strain (CARDIA)    Difficulty of Paying Living Expenses: Not very hard  Food Insecurity: No Food Insecurity (07/08/2023)   Hunger Vital Sign    Worried About Running Out of Food in the Last Year: Never true    Ran Out of Food in the Last Year: Never true  Transportation Needs: No Transportation Needs (07/08/2023)   PRAPARE - Administrator, Civil Service (Medical): No    Lack of Transportation (Non-Medical): No  Physical Activity: Sufficiently Active (07/08/2023)   Exercise Vital Sign    Days of Exercise per Week: 5 days    Minutes of Exercise per Session: 60 min  Stress: Stress Concern Present (07/08/2023)   [employer] of  Occupational Health - Occupational Stress Questionnaire    Feeling of Stress : To some extent  Social Connections: Unknown (07/08/2023)   Social Connection and Isolation Panel [NHANES]    Frequency of Communication with Friends and Family: More than three times a week    Frequency of Social Gatherings with Friends and Family: Patient declined    Attends Religious Services: More than 4 times per year    Active Member of [employer] or Organizations: Yes    Attends Banker Meetings: Patient declined    Marital Status: Patient declined  Intimate Partner Violence: Not on file    Review of Systems  All other systems reviewed and are negative.      Objective:   Physical Exam Vitals reviewed.  Constitutional:      Appearance: Normal appearance.   HENT:     Right Ear: Tympanic membrane and ear canal normal.     Left Ear: Tympanic membrane and ear canal normal.     Mouth/Throat:     Mouth: Mucous membranes are moist.     Pharynx: Oropharynx is clear. No oropharyngeal exudate or posterior oropharyngeal erythema.  Cardiovascular:     Rate and Rhythm: Normal rate and regular rhythm.     Heart sounds: Normal heart sounds. No murmur heard. Pulmonary:     Effort: Pulmonary effort is normal. No respiratory distress.     Breath sounds: Normal breath sounds. No stridor.  Lymphadenopathy:     Cervical: No cervical adenopathy.  Skin:    Findings: No rash.  Neurological:     Mental Status: She is alert.           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  I recommended Xanax 0.5 mg every 8 hours as needed for anxiety, stress.  Also give the patient a prednisone taper pack.  If she does not respond to the Xanax, she could try adding the prednisone tomorrow in case she is having urticaria due to an allergic exposure.  She is already tried Benadryl at home with no success.

## 2023-07-26 ENCOUNTER — Other Ambulatory Visit: Payer: Self-pay | Admitting: Family Medicine

## 2023-07-26 DIAGNOSIS — I10 Essential (primary) hypertension: Secondary | ICD-10-CM

## 2023-07-27 NOTE — Telephone Encounter (Signed)
 Requested Prescriptions  Pending Prescriptions Disp Refills   nebivolol (BYSTOLIC) 10 MG tablet [Pharmacy Med Name: Nebivolol HCl Oral Tablet 10 MG] 90 tablet 1    Sig: TAKE ONE TABLET BY MOUTH ONCE A DAY     Cardiovascular: Beta Blockers 3 Failed - 07/27/2023  4:21 PM      Failed - Last BP in normal range    BP Readings from Last 1 Encounters:  07/08/23 (!) 142/86         Failed - Valid encounter within last 6 months    Recent Outpatient Visits           2 years ago Essential hypertension   [facility] Donita Brooks, MD   3 years ago Essential hypertension   [facility] [place], Velna Hatchet, MD   3 years ago Essential hypertension   [facility] [place], Velna Hatchet, MD   3 years ago Polyarthralgia   [facility] [place], Velna Hatchet, MD   4 years ago Annual physical exam   [facility] Elmore Guise, FNP              Passed - Cr in normal range and within 360 days    Creat  Date Value Ref Range Status  04/26/2023 0.91 0.50 - 1.03 mg/dL Final         Passed - AST in normal range and within 360 days    AST  Date Value Ref Range Status  04/26/2023 16 10 - 35 U/L Final         Passed - ALT in normal range and within 360 days    ALT  Date Value Ref Range Status  04/26/2023 16 6 - 29 U/L Final         Passed - Last Heart Rate in normal range    Pulse Readings from Last 1 Encounters:  07/08/23 72

## 2023-08-11 ENCOUNTER — Other Ambulatory Visit: Payer: Self-pay | Admitting: Obstetrics and Gynecology

## 2023-08-11 DIAGNOSIS — Z9189 Other specified personal risk factors, not elsewhere classified: Secondary | ICD-10-CM

## 2023-08-17 ENCOUNTER — Encounter: Payer: Self-pay | Admitting: Family Medicine

## 2023-08-17 ENCOUNTER — Other Ambulatory Visit: Payer: Self-pay | Admitting: Family Medicine

## 2023-08-17 MED ORDER — WEGOVY 0.5 MG/0.5ML ~~LOC~~ SOAJ: 0.5000 mg | SUBCUTANEOUS | 1 refills | Status: DC

## 2023-09-07 ENCOUNTER — Ambulatory Visit

## 2023-09-08 ENCOUNTER — Encounter: Payer: Self-pay | Admitting: Obstetrics and Gynecology

## 2023-09-12 ENCOUNTER — Ambulatory Visit
Admission: RE | Admit: 2023-09-12 | Discharge: 2023-09-12 | Disposition: A | Discharge status: Home / Self Care | Source: Ambulatory Visit | Attending: Obstetrics and Gynecology | Admitting: Obstetrics and Gynecology

## 2023-09-12 DIAGNOSIS — Z9189 Other specified personal risk factors, not elsewhere classified: Secondary | ICD-10-CM

## 2023-09-12 MED ORDER — GADOPICLENOL 0.5 MMOL/ML IV SOLN
8.0000 mL | Freq: Once | INTRAVENOUS | Status: AC | PRN
Start: 2023-09-12 — End: 2023-09-12
  Administered 2023-09-12: 8 mL via INTRAVENOUS

## 2023-12-03 ENCOUNTER — Encounter: Payer: Self-pay | Admitting: Family Medicine

## 2023-12-07 ENCOUNTER — Other Ambulatory Visit: Payer: Self-pay | Admitting: Family Medicine

## 2023-12-07 MED ORDER — PHENTERMINE-TOPIRAMATE ER 3.75-23 MG PO CP24: 1.0000 | ORAL_CAPSULE | Freq: Every day | ORAL | 1 refills | Status: AC

## 2024-01-11 ENCOUNTER — Encounter: Payer: Self-pay | Admitting: Family Medicine

## 2024-01-12 ENCOUNTER — Other Ambulatory Visit (HOSPITAL_COMMUNITY): Payer: Self-pay

## 2024-01-12 NOTE — Telephone Encounter (Signed)
 Patient's last office visit was on 07/08/2023. Patient would need an office visit to discuss weight loss and to determine a baseline weight and BMI that would be required to be sent to the insurance to establish medical necessity. Please advise.

## 2024-01-22 ENCOUNTER — Other Ambulatory Visit: Payer: Self-pay | Admitting: Family Medicine

## 2024-01-22 DIAGNOSIS — I10 Essential (primary) hypertension: Secondary | ICD-10-CM

## 2024-01-24 NOTE — Telephone Encounter (Signed)
 OFFICE VISIT NEEDED FOR ADDITIONAL REFILLS.  Requested Prescriptions  Pending Prescriptions Disp Refills   nebivolol  (BYSTOLIC ) 10 MG tablet [Pharmacy Med Name: Nebivolol  HCl Oral Tablet 10 MG] 90 tablet 0    Sig: TAKE ONE TABLET BY MOUTH ONCE A DAY     Cardiovascular: Beta Blockers 3 Failed - 01/24/2024 12:18 PM      Failed - Last BP in normal range    BP Readings from Last 1 Encounters:  07/08/23 (!) 142/86         Failed - Valid encounter within last 6 months    Recent Outpatient Visits           6 months ago Urticaria   [place] [facility] Duanne Butler DASEN, MD   9 months ago Benign essential HTN   [place] [facility] Duanne Butler DASEN, MD   1 year ago Alopecia   [place] [facility] Duanne Butler DASEN, MD   1 year ago Canker sores oral   [place] [facility] Duanne Butler DASEN, MD   2 years ago Elevated blood sugar level   [place] [facility] [place], Butler DASEN, MD              Passed - Cr in normal range and within 360 days    Creat  Date Value Ref Range Status  04/26/2023 0.91 0.50 - 1.03 mg/dL Final         Passed - AST in normal range and within 360 days    AST  Date Value Ref Range Status  04/26/2023 16 10 - 35 U/L Final         Passed - ALT in normal range and within 360 days    ALT  Date Value Ref Range Status  04/26/2023 16 6 - 29 U/L Final         Passed - Last Heart Rate in normal range    Pulse Readings from Last 1 Encounters:  07/08/23 72

## 2024-04-21 ENCOUNTER — Other Ambulatory Visit: Payer: Self-pay | Admitting: Family Medicine

## 2024-04-21 DIAGNOSIS — I10 Essential (primary) hypertension: Secondary | ICD-10-CM

## 2024-04-24 NOTE — Telephone Encounter (Signed)
 Courtesy refill. Patient will need an office visit for additional refills.  Requested Prescriptions  Pending Prescriptions Disp Refills   nebivolol  (BYSTOLIC ) 10 MG tablet [Pharmacy Med Name: Nebivolol  HCl Oral Tablet 10 MG] 30 tablet 0    Sig: TAKE ONE TABLET BY MOUTH ONCE A DAY     Cardiovascular: Beta Blockers 3 Failed - 04/24/2024  2:29 PM      Failed - Cr in normal range and within 360 days    Creat  Date Value Ref Range Status  04/26/2023 0.91 0.50 - 1.03 mg/dL Final         Failed - AST in normal range and within 360 days    AST  Date Value Ref Range Status  04/26/2023 16 10 - 35 U/L Final         Failed - ALT in normal range and within 360 days    ALT  Date Value Ref Range Status  04/26/2023 16 6 - 29 U/L Final         Failed - Last BP in normal range    BP Readings from Last 1 Encounters:  07/08/23 (!) 142/86         Failed - Valid encounter within last 6 months    Recent Outpatient Visits           9 months ago Urticaria   [place] [facility] Duanne Butler DASEN, MD   12 months ago Benign essential HTN   [facility] Duanne, Butler DASEN, MD   1 year ago Alopecia   [facility] Duanne Butler DASEN, MD   2 years ago Canker sores oral   [place] [facility] Duanne Butler DASEN, MD   2 years ago Elevated blood sugar level   [facility] Duanne Butler DASEN, MD              Passed - Last Heart Rate in normal range    Pulse Readings from Last 1 Encounters:  07/08/23 72

## 2024-05-04 ENCOUNTER — Telehealth: Payer: Self-pay

## 2024-05-04 ENCOUNTER — Ambulatory Visit: Admitting: Family Medicine

## 2024-05-04 ENCOUNTER — Other Ambulatory Visit (HOSPITAL_COMMUNITY): Payer: Self-pay

## 2024-05-04 ENCOUNTER — Encounter: Payer: Self-pay | Admitting: Family Medicine

## 2024-05-04 VITALS — BP 128/82 | HR 57 | Temp 98.0°F | Ht 62.0 in | Wt 192.0 lb

## 2024-05-04 DIAGNOSIS — I10 Essential (primary) hypertension: Secondary | ICD-10-CM | POA: Diagnosis not present

## 2024-05-04 DIAGNOSIS — R5383 Other fatigue: Secondary | ICD-10-CM | POA: Diagnosis not present

## 2024-05-04 MED ORDER — WEGOVY 0.25 MG/0.5ML ~~LOC~~ SOAJ: 0.2500 mg | SUBCUTANEOUS | 1 refills | Status: DC

## 2024-05-04 MED ORDER — NEBIVOLOL HCL 10 MG PO TABS: 10.0000 mg | ORAL_TABLET | Freq: Every day | ORAL | 3 refills | Status: AC

## 2024-05-04 NOTE — Progress Notes (Signed)
 Subjective:    Patient ID: Kristi Rush, female    DOB: 1966/03/08, 57 y.o.   MRN: [id number]  HPI  Patient has a history of hypertension.  She is currently on Bystolic  10 mg daily.  She denies any chest pain or shortness of breath or dyspnea on exertion.  Her BMI is 35 and her blood pressure is related to her weight.  She would like to try submitting the Wegovy  again to see if her new insurance will cover it.  Her mammogram and Pap smear and bone density are performed by her gynecologist.  Her colonoscopy is up-to-date.  She declines a flu shot, shingles vaccine, and a pneumonia shot today Past Medical History:  Diagnosis Date   H/O osteopenia    Headache    Hypertension    Osteoporosis    Squamous cell carcinoma of skin 2013   left mid back tx-cx3 31fu   Past Surgical History:  Procedure Laterality Date   COLONOSCOPY N/A 02/23/2018   Procedure: COLONOSCOPY;  Surgeon: Golda Claudis PENNER, MD;  [facility]: [facility];  Service: Endoscopy;  Laterality: N/A;  730   WISDOM TOOTH EXTRACTION     many year ago   Current Outpatient Medications on File Prior to Visit  Medication Sig Dispense Refill   ALPRAZolam  (XANAX ) 0.5 MG tablet Take 1 tablet (0.5 mg total) by mouth 3 (three) times daily as needed for anxiety. 30 tablet 0   Insulin  Pen Needle (NOVOFINE) 30G X 8 MM MISC Use to inject medication daily. 100 each 5   nebivolol  (BYSTOLIC ) 10 MG tablet TAKE ONE TABLET BY MOUTH ONCE A DAY 30 tablet 0   ondansetron  (ZOFRAN -ODT) 4 MG disintegrating tablet PLACE 1 TABLET ON THE TONGUE AND ALLOW TO DISSOLVE THEN SWALLOW EVERY 8 HOURS AS NEEDED FOR NAUSEA 30 tablet 0   ondansetron  (ZOFRAN -ODT) 4 MG disintegrating tablet Take by mouth.     Phentermine -Topiramate  ER (QSYMIA ) 3.75-23 MG CP24 Take 1 capsule by mouth daily at 6 (six) AM. 30 capsule 1   No current facility-administered medications on file prior to visit.   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  Tobacco Use   Smoking status: Never   Smokeless tobacco: Never  Vaping Use   Vaping status: Never Used  Substance and Sexual Activity   Alcohol use: No   Drug use: No   Sexual activity: Not on file  Other Topics Concern   Not on file  Social History Narrative   Not on file   Social Drivers of Health   Tobacco Use: Low Risk (07/08/2023)   Patient History    Smoking Tobacco Use: Never    Smokeless Tobacco Use: Never    Passive Exposure: Not on file  Financial Resource Strain: Low Risk (07/08/2023)   Overall Financial Resource Strain (CARDIA)    Difficulty of Paying Living Expenses: Not very hard  Food Insecurity: No Food Insecurity (07/08/2023)   Hunger Vital Sign    Worried About Running Out of Food in the Last Year: Never true    Ran Out of Food in the Last Year: Never true  Transportation Needs: No Transportation Needs (07/08/2023)   PRAPARE - Administrator, Civil Service (Medical): No    Lack of Transportation (Non-Medical): No  Physical Activity: Sufficiently Active (07/08/2023)   Exercise Vital Sign    Days of Exercise per Week: 5 days    Minutes of Exercise per Session: 60 min  Stress: Stress Concern Present (07/08/2023)   [employer] of Occupational Health - Occupational Stress Questionnaire    Feeling of Stress : To some extent  Social Connections: Unknown (07/08/2023)   Social Connection and Isolation Panel    Frequency of Communication with Friends and Family: More than three times a week    Frequency of Social Gatherings with Friends and Family: Patient declined    Attends Religious Services: More than 4 times per year    Active Member of [employer] or Organizations:  Yes    Attends Banker Meetings: Patient declined    Marital Status: Patient declined  Intimate Partner Violence: Not on file  Depression (PHQ2-9): Low Risk (04/26/2023)   Depression (PHQ2-9)    PHQ-2 Score: 0  Alcohol Screen: Low Risk (07/08/2023)   Alcohol Screen    Last Alcohol Screening Score (AUDIT): 1  Housing: Low Risk (07/08/2023)   Housing Stability Vital Sign    Unable to Pay for Housing in the Last Year: No    Number of Times Moved in the Last Year: 0    Homeless in the Last Year: No  Utilities: Not on file  Health Literacy: Not on file    Review of Systems  All other systems reviewed and are negative.      Objective:   Physical Exam Vitals reviewed.  Constitutional:      Appearance: Normal appearance.  HENT:     Right Ear: Tympanic membrane and ear canal normal.     Left Ear: Tympanic membrane and ear canal normal.     Mouth/Throat:     Mouth: Mucous membranes are moist.     Pharynx: Oropharynx is clear. No oropharyngeal exudate or posterior oropharyngeal erythema.  Cardiovascular:     Rate and Rhythm: Normal rate and regular rhythm.     Heart sounds: Normal heart sounds. No murmur heard. Pulmonary:     Effort: Pulmonary effort is normal. No respiratory distress.     Breath sounds: Normal breath sounds. No stridor.  Lymphadenopathy:     Cervical: No cervical adenopathy.  Skin:    Findings: No rash.  Neurological:     Mental Status: She is alert.           Assessment & Plan:  Benign essential HTN - Plan: CBC with Differential/Platelet, Comprehensive metabolic panel with GFR, Lipid panel  Other fatigue - Plan: T4, free, TSH  Essential hypertension - Plan: nebivolol  (BYSTOLIC ) 10 MG tablet I am very happy with her blood pressure.  The patient does report feeling fatigued.  Therefore we will check a TSH and a T4.  I will also check a CBC a CMP and a lipid panel.  Goal LDL cholesterol is less than 100.  Mammogram, bone density, and Pap  smear performed at her gynecologist office.  I recommended the shingles vaccine and the pneumonia vaccine today but the patient politely declined.  I will resubmit Wegovy  to see if we can get this approved

## 2024-05-04 NOTE — Telephone Encounter (Signed)
 Pharmacy Patient Advocate Encounter   Received notification from Physician's Office that prior authorization for Wegovy  0.25 mg/0.5 ml is required/requested.   Insurance verification completed.   The patient is insured through [facility] [employer].   Per test claim: Refill too soon. PA is not needed at this time. Medication was filled 05/04/2024. Next eligible fill date is 05/25/2024.

## 2024-05-05 ENCOUNTER — Ambulatory Visit: Payer: Self-pay | Admitting: Family Medicine

## 2024-05-05 ENCOUNTER — Other Ambulatory Visit (HOSPITAL_COMMUNITY): Payer: Self-pay

## 2024-05-05 LAB — LIPID PANEL
Cholesterol: 233 mg/dL — ABNORMAL HIGH
HDL: 55 mg/dL
LDL Cholesterol (Calc): 153 mg/dL — ABNORMAL HIGH
Non-HDL Cholesterol (Calc): 178 mg/dL — ABNORMAL HIGH
Total CHOL/HDL Ratio: 4.2 (calc)
Triglycerides: 129 mg/dL

## 2024-05-05 LAB — CBC WITH DIFFERENTIAL/PLATELET
Absolute Lymphocytes: 1924 {cells}/uL (ref 850–3900)
Absolute Monocytes: 598 {cells}/uL (ref 200–950)
Basophils Absolute: 39 {cells}/uL (ref 0–200)
Basophils Relative: 0.6 %
Eosinophils Absolute: 52 {cells}/uL (ref 15–500)
Eosinophils Relative: 0.8 %
HCT: 43.4 % (ref 35.9–46.0)
Hemoglobin: 14.6 g/dL (ref 11.7–15.5)
MCH: 29.8 pg (ref 27.0–33.0)
MCHC: 33.6 g/dL (ref 31.6–35.4)
MCV: 88.6 fL (ref 81.4–101.7)
MPV: 11.2 fL (ref 7.5–12.5)
Monocytes Relative: 9.2 %
Neutro Abs: 3887 {cells}/uL (ref 1500–7800)
Neutrophils Relative %: 59.8 %
Platelets: 298 Thousand/uL (ref 140–400)
RBC: 4.9 Million/uL (ref 3.80–5.10)
RDW: 12.4 % (ref 11.0–15.0)
Total Lymphocyte: 29.6 %
WBC: 6.5 Thousand/uL (ref 3.8–10.8)

## 2024-05-05 LAB — COMPREHENSIVE METABOLIC PANEL WITH GFR
AG Ratio: 1.7 (calc) (ref 1.0–2.5)
ALT: 24 U/L (ref 6–29)
AST: 21 U/L (ref 10–35)
Albumin: 4.3 g/dL (ref 3.6–5.1)
Alkaline phosphatase (APISO): 69 U/L (ref 37–153)
BUN: 14 mg/dL (ref 7–25)
CO2: 29 mmol/L (ref 20–32)
Calcium: 9.7 mg/dL (ref 8.6–10.4)
Chloride: 103 mmol/L (ref 98–110)
Creat: 0.84 mg/dL (ref 0.50–1.03)
Globulin: 2.6 g/dL (ref 1.9–3.7)
Glucose, Bld: 82 mg/dL (ref 65–99)
Potassium: 4.8 mmol/L (ref 3.5–5.3)
Sodium: 139 mmol/L (ref 135–146)
Total Bilirubin: 0.3 mg/dL (ref 0.2–1.2)
Total Protein: 6.9 g/dL (ref 6.1–8.1)
eGFR: 80 mL/min/1.73m2

## 2024-05-05 LAB — TSH: TSH: 1.16 m[IU]/L (ref 0.40–4.50)

## 2024-05-05 LAB — T4, FREE: Free T4: 1.3 ng/dL (ref 0.8–1.8)

## 2024-05-05 NOTE — Telephone Encounter (Signed)
 Good morning Kristi Rush, Kristi Rush will need to call her insurance so that they can explain to her the reason for copay. Her prescription plan only discounts Wegovy  so that she is able to use a copay savings card, when we submit the prescription we get a paid claim the medication cost is $1709.77 but her cost is $1234.00. Her cost will be $650.00 after a Wegovy  savings card.

## 2024-05-25 ENCOUNTER — Encounter: Payer: Self-pay | Admitting: Family Medicine

## 2024-06-13 ENCOUNTER — Other Ambulatory Visit: Payer: Self-pay

## 2024-06-13 MED ORDER — WEGOVY 1.5 MG PO TABS: 1.5000 mg | ORAL_TABLET | Freq: Every day | ORAL | 1 refills | Status: AC
